# Patient Record
Sex: Female | Born: 1985
Health system: Southern US, Community
[De-identification: ages and names within clinical notes are randomized; demographics above are authoritative.]

## PROBLEM LIST (undated history)

## (undated) DIAGNOSIS — Z8619 Personal history of other infectious and parasitic diseases: Secondary | ICD-10-CM

## (undated) DIAGNOSIS — K219 Gastro-esophageal reflux disease without esophagitis: Secondary | ICD-10-CM

## (undated) DIAGNOSIS — A0100 Typhoid fever, unspecified: Secondary | ICD-10-CM

## (undated) DIAGNOSIS — A159 Respiratory tuberculosis unspecified: Secondary | ICD-10-CM

## (undated) DIAGNOSIS — F32A Depression, unspecified: Secondary | ICD-10-CM

## (undated) DIAGNOSIS — F329 Major depressive disorder, single episode, unspecified: Secondary | ICD-10-CM

## (undated) DIAGNOSIS — B019 Varicella without complication: Secondary | ICD-10-CM

## (undated) HISTORY — DX: Personal history of other infectious and parasitic diseases: Z86.19

## (undated) HISTORY — DX: Major depressive disorder, single episode, unspecified: F32.9

## (undated) HISTORY — DX: Gastro-esophageal reflux disease without esophagitis: K21.9

## (undated) HISTORY — DX: Depression, unspecified: F32.A

---

## 2017-03-30 LAB — OB RESULTS CONSOLE GC/CHLAMYDIA
CHLAMYDIA, DNA PROBE: NEGATIVE
Gonorrhea: NEGATIVE

## 2017-04-23 LAB — OB RESULTS CONSOLE RUBELLA ANTIBODY, IGM: Rubella: IMMUNE

## 2017-04-23 LAB — OB RESULTS CONSOLE ABO/RH: RH TYPE: POSITIVE

## 2017-04-23 LAB — OB RESULTS CONSOLE ANTIBODY SCREEN: Antibody Screen: NEGATIVE

## 2017-04-23 LAB — OB RESULTS CONSOLE HEPATITIS B SURFACE ANTIGEN: HEP B S AG: NEGATIVE

## 2017-04-23 LAB — OB RESULTS CONSOLE GBS: STREP GROUP B AG: POSITIVE

## 2017-04-23 LAB — OB RESULTS CONSOLE RPR: RPR: NONREACTIVE

## 2017-04-23 LAB — OB RESULTS CONSOLE HIV ANTIBODY (ROUTINE TESTING): HIV: NONREACTIVE

## 2017-09-07 NOTE — L&D Delivery Note (Signed)
Delivery Note At 3:35 PM a viable female was delivered via Vaginal, Spontaneous via OA presentation  APGAR:  9 9  weight pending .   Placenta status:spontaneously with 3 vessel cord , .  Cord:  with the following complications: none  Cord pH: not obtained  Anesthesia:  epidural Episiotomy: None Lacerations: 2nd degree Suture Repair: 3.0 chromic Est. Blood Loss (mL): 300  Mom to postpartum.  Baby to Couplet care / Skin to Skin.  Jenny Lane L 11/15/2017, 3:48 PM

## 2017-11-05 ENCOUNTER — Institutional Professional Consult (permissible substitution): Payer: Self-pay | Admitting: Pediatrics

## 2017-11-06 ENCOUNTER — Encounter: Payer: Self-pay | Admitting: Pediatrics

## 2017-11-06 ENCOUNTER — Ambulatory Visit (INDEPENDENT_AMBULATORY_CARE_PROVIDER_SITE_OTHER): Payer: Self-pay | Admitting: Pediatrics

## 2017-11-06 DIAGNOSIS — Z7681 Expectant parent(s) prebirth pediatrician visit: Secondary | ICD-10-CM

## 2017-11-06 NOTE — Progress Notes (Signed)
Prenatal counseling for impending newborn done-- Z76.81  

## 2017-11-09 ENCOUNTER — Institutional Professional Consult (permissible substitution): Payer: Self-pay | Admitting: Pediatrics

## 2017-11-15 ENCOUNTER — Inpatient Hospital Stay (HOSPITAL_COMMUNITY): Payer: 59 | Admitting: Anesthesiology

## 2017-11-15 ENCOUNTER — Inpatient Hospital Stay (HOSPITAL_COMMUNITY)
Admission: RE | Admit: 2017-11-15 | Discharge: 2017-11-17 | DRG: 807 | Disposition: A | Discharge status: Home / Self Care | Payer: 59 | Source: Ambulatory Visit | Attending: Obstetrics and Gynecology | Admitting: Obstetrics and Gynecology

## 2017-11-15 ENCOUNTER — Other Ambulatory Visit: Payer: Self-pay

## 2017-11-15 ENCOUNTER — Encounter (HOSPITAL_COMMUNITY): Payer: Self-pay

## 2017-11-15 DIAGNOSIS — O99824 Streptococcus B carrier state complicating childbirth: Secondary | ICD-10-CM | POA: Diagnosis present

## 2017-11-15 DIAGNOSIS — Z3A40 40 weeks gestation of pregnancy: Secondary | ICD-10-CM | POA: Diagnosis not present

## 2017-11-15 DIAGNOSIS — O480 Post-term pregnancy: Secondary | ICD-10-CM | POA: Diagnosis present

## 2017-11-15 HISTORY — DX: Varicella without complication: B01.9

## 2017-11-15 HISTORY — DX: Typhoid fever, unspecified: A01.00

## 2017-11-15 HISTORY — DX: Respiratory tuberculosis unspecified: A15.9

## 2017-11-15 LAB — CBC
HCT: 39.5 % (ref 36.0–46.0)
Hemoglobin: 13.8 g/dL (ref 12.0–15.0)
MCH: 30.1 pg (ref 26.0–34.0)
MCHC: 34.9 g/dL (ref 30.0–36.0)
MCV: 86.1 fL (ref 78.0–100.0)
PLATELETS: 218 10*3/uL (ref 150–400)
RBC: 4.59 MIL/uL (ref 3.87–5.11)
RDW: 15 % (ref 11.5–15.5)
WBC: 12.7 10*3/uL — ABNORMAL HIGH (ref 4.0–10.5)

## 2017-11-15 LAB — RPR: RPR Ser Ql: NONREACTIVE

## 2017-11-15 LAB — TYPE AND SCREEN
ABO/RH(D): O POS
Antibody Screen: NEGATIVE

## 2017-11-15 LAB — ABO/RH: ABO/RH(D): O POS

## 2017-11-15 MED ORDER — OXYTOCIN 40 UNITS IN LACTATED RINGERS INFUSION - SIMPLE MED
1.0000 m[IU]/min | INTRAVENOUS | Status: DC
  Administered 2017-11-15: 6 m[IU]/min via INTRAVENOUS
  Administered 2017-11-15: 12 m[IU]/min via INTRAVENOUS
  Administered 2017-11-15: 4 m[IU]/min via INTRAVENOUS
  Administered 2017-11-15: 10 m[IU]/min via INTRAVENOUS
  Administered 2017-11-15: 8 m[IU]/min via INTRAVENOUS
  Administered 2017-11-15: 2 m[IU]/min via INTRAVENOUS
  Administered 2017-11-15: 14 m[IU]/min via INTRAVENOUS

## 2017-11-15 MED ORDER — SENNOSIDES-DOCUSATE SODIUM 8.6-50 MG PO TABS
2.0000 | ORAL_TABLET | ORAL | Status: DC
  Administered 2017-11-16 – 2017-11-17 (×2): 2 via ORAL
  Filled 2017-11-15 (×3): qty 2

## 2017-11-15 MED ORDER — OXYCODONE-ACETAMINOPHEN 5-325 MG PO TABS: 1.0000 | ORAL_TABLET | ORAL | Status: DC | PRN

## 2017-11-15 MED ORDER — FLEET ENEMA 7-19 GM/118ML RE ENEM: 1.0000 | ENEMA | Freq: Every day | RECTAL | Status: DC | PRN

## 2017-11-15 MED ORDER — ACETAMINOPHEN 325 MG PO TABS
650.0000 mg | ORAL_TABLET | ORAL | Status: DC | PRN
  Administered 2017-11-15 – 2017-11-16 (×2): 650 mg via ORAL
  Filled 2017-11-15 (×2): qty 2

## 2017-11-15 MED ORDER — PHENYLEPHRINE 40 MCG/ML (10ML) SYRINGE FOR IV PUSH (FOR BLOOD PRESSURE SUPPORT)
80.0000 ug | PREFILLED_SYRINGE | INTRAVENOUS | Status: DC | PRN
  Filled 2017-11-15: qty 10
  Filled 2017-11-15: qty 5
  Filled 2017-11-15: qty 10

## 2017-11-15 MED ORDER — MEASLES, MUMPS & RUBELLA VAC ~~LOC~~ INJ: 0.5000 mL | INJECTION | Freq: Once | SUBCUTANEOUS | Status: DC

## 2017-11-15 MED ORDER — BENZOCAINE-MENTHOL 20-0.5 % EX AERO
1.0000 "application " | INHALATION_SPRAY | CUTANEOUS | Status: DC | PRN
  Administered 2017-11-15 – 2017-11-17 (×2): 1 via TOPICAL
  Filled 2017-11-15 (×2): qty 56

## 2017-11-15 MED ORDER — EPHEDRINE 5 MG/ML INJ
10.0000 mg | INTRAVENOUS | Status: DC | PRN
  Filled 2017-11-15: qty 2

## 2017-11-15 MED ORDER — MEDROXYPROGESTERONE ACETATE 150 MG/ML IM SUSP: 150.0000 mg | INTRAMUSCULAR | Status: DC | PRN

## 2017-11-15 MED ORDER — ONDANSETRON HCL 4 MG/2ML IJ SOLN: 4.0000 mg | INTRAMUSCULAR | Status: DC | PRN

## 2017-11-15 MED ORDER — TERBUTALINE SULFATE 1 MG/ML IJ SOLN
0.2500 mg | Freq: Once | INTRAMUSCULAR | Status: DC | PRN
  Filled 2017-11-15: qty 1

## 2017-11-15 MED ORDER — LACTATED RINGERS IV SOLN
500.0000 mL | Freq: Once | INTRAVENOUS | Status: AC
  Administered 2017-11-15: 500 mL via INTRAVENOUS

## 2017-11-15 MED ORDER — DIBUCAINE 1 % RE OINT: 1.0000 "application " | TOPICAL_OINTMENT | RECTAL | Status: DC | PRN

## 2017-11-15 MED ORDER — PHENYLEPHRINE 40 MCG/ML (10ML) SYRINGE FOR IV PUSH (FOR BLOOD PRESSURE SUPPORT)
80.0000 ug | PREFILLED_SYRINGE | INTRAVENOUS | Status: DC | PRN
  Filled 2017-11-15: qty 5

## 2017-11-15 MED ORDER — LIDOCAINE HCL (PF) 1 % IJ SOLN
30.0000 mL | INTRAMUSCULAR | Status: DC | PRN
  Filled 2017-11-15: qty 30

## 2017-11-15 MED ORDER — OXYTOCIN 40 UNITS IN LACTATED RINGERS INFUSION - SIMPLE MED
INTRAVENOUS | Status: AC
  Administered 2017-11-15: 2.5 [IU]/h via INTRAVENOUS
  Filled 2017-11-15: qty 1000

## 2017-11-15 MED ORDER — BISACODYL 10 MG RE SUPP: 10.0000 mg | Freq: Every day | RECTAL | Status: DC | PRN

## 2017-11-15 MED ORDER — ACETAMINOPHEN 325 MG PO TABS: 650.0000 mg | ORAL_TABLET | ORAL | Status: DC | PRN

## 2017-11-15 MED ORDER — FENTANYL 2.5 MCG/ML BUPIVACAINE 1/10 % EPIDURAL INFUSION (WH - ANES)
14.0000 mL/h | INTRAMUSCULAR | Status: DC | PRN
  Administered 2017-11-15: 14 mL/h via EPIDURAL
  Filled 2017-11-15: qty 100

## 2017-11-15 MED ORDER — OXYTOCIN 40 UNITS IN LACTATED RINGERS INFUSION - SIMPLE MED
2.5000 [IU]/h | INTRAVENOUS | Status: DC
  Administered 2017-11-15: 2.5 [IU]/h via INTRAVENOUS

## 2017-11-15 MED ORDER — LACTATED RINGERS IV SOLN
500.0000 mL | INTRAVENOUS | Status: DC | PRN
  Administered 2017-11-15: 500 mL via INTRAVENOUS

## 2017-11-15 MED ORDER — ONDANSETRON HCL 4 MG/2ML IJ SOLN: 4.0000 mg | Freq: Four times a day (QID) | INTRAMUSCULAR | Status: DC | PRN

## 2017-11-15 MED ORDER — PRENATAL MULTIVITAMIN CH
1.0000 | ORAL_TABLET | Freq: Every day | ORAL | Status: DC
  Administered 2017-11-16: 1 via ORAL
  Filled 2017-11-15: qty 1

## 2017-11-15 MED ORDER — OXYCODONE-ACETAMINOPHEN 5-325 MG PO TABS: 2.0000 | ORAL_TABLET | ORAL | Status: DC | PRN

## 2017-11-15 MED ORDER — SOD CITRATE-CITRIC ACID 500-334 MG/5ML PO SOLN: 30.0000 mL | ORAL | Status: DC | PRN

## 2017-11-15 MED ORDER — PENICILLIN G POT IN DEXTROSE 60000 UNIT/ML IV SOLN
3.0000 10*6.[IU] | INTRAVENOUS | Status: DC
  Administered 2017-11-15: 3 10*6.[IU] via INTRAVENOUS
  Filled 2017-11-15 (×4): qty 50

## 2017-11-15 MED ORDER — OXYTOCIN BOLUS FROM INFUSION
500.0000 mL | Freq: Once | INTRAVENOUS | Status: AC
  Administered 2017-11-15: 500 mL via INTRAVENOUS

## 2017-11-15 MED ORDER — ONDANSETRON HCL 4 MG PO TABS: 4.0000 mg | ORAL_TABLET | ORAL | Status: DC | PRN

## 2017-11-15 MED ORDER — DIPHENHYDRAMINE HCL 25 MG PO CAPS: 25.0000 mg | ORAL_CAPSULE | Freq: Four times a day (QID) | ORAL | Status: DC | PRN

## 2017-11-15 MED ORDER — WITCH HAZEL-GLYCERIN EX PADS: 1.0000 "application " | MEDICATED_PAD | CUTANEOUS | Status: DC | PRN

## 2017-11-15 MED ORDER — ZOLPIDEM TARTRATE 5 MG PO TABS: 5.0000 mg | ORAL_TABLET | Freq: Every evening | ORAL | Status: DC | PRN

## 2017-11-15 MED ORDER — IBUPROFEN 600 MG PO TABS
600.0000 mg | ORAL_TABLET | Freq: Four times a day (QID) | ORAL | Status: DC
  Administered 2017-11-15 – 2017-11-17 (×8): 600 mg via ORAL
  Filled 2017-11-15 (×8): qty 1

## 2017-11-15 MED ORDER — SIMETHICONE 80 MG PO CHEW: 80.0000 mg | CHEWABLE_TABLET | ORAL | Status: DC | PRN

## 2017-11-15 MED ORDER — LIDOCAINE HCL (PF) 1 % IJ SOLN
INTRAMUSCULAR | Status: DC | PRN
  Administered 2017-11-15: 13 mL via EPIDURAL

## 2017-11-15 MED ORDER — LACTATED RINGERS IV SOLN
INTRAVENOUS | Status: DC
  Administered 2017-11-15 (×2): via INTRAVENOUS

## 2017-11-15 MED ORDER — SODIUM CHLORIDE 0.9 % IV SOLN
5.0000 10*6.[IU] | Freq: Once | INTRAVENOUS | Status: AC
  Administered 2017-11-15: 5 10*6.[IU] via INTRAVENOUS
  Filled 2017-11-15: qty 5

## 2017-11-15 MED ORDER — TETANUS-DIPHTH-ACELL PERTUSSIS 5-2.5-18.5 LF-MCG/0.5 IM SUSP: 0.5000 mL | Freq: Once | INTRAMUSCULAR | Status: DC

## 2017-11-15 MED ORDER — COCONUT OIL OIL: 1.0000 "application " | TOPICAL_OIL | Status: DC | PRN

## 2017-11-15 MED ORDER — DIPHENHYDRAMINE HCL 50 MG/ML IJ SOLN: 12.5000 mg | INTRAMUSCULAR | Status: DC | PRN

## 2017-11-15 NOTE — Anesthesia Postprocedure Evaluation (Addendum)
Anesthesia Post Note  Patient: Jenny Lane  Procedure(s) Performed: AN AD HOC LABOR EPIDURAL     Patient location during evaluation: Mother Baby Anesthesia Type: Epidural Level of consciousness: awake, awake and alert, oriented and patient cooperative Pain management: pain level controlled Vital Signs Assessment: post-procedure vital signs reviewed and stable Respiratory status: spontaneous breathing Cardiovascular status: stable Postop Assessment: patient able to bend at knees, epidural receding, no apparent nausea or vomiting, no headache and no backache Anesthetic complications: no    Last Vitals:  Vitals:   11/15/17 1740 11/15/17 1838  BP: 117/79 124/83  Pulse: 70 75  Resp: 18 18  Temp: 36.8 C 37 C  SpO2:      Last Pain:  Vitals:   11/15/17 1840  TempSrc:   PainSc: 0-No pain   Pain Goal:                 Jennelle HumanLisa B Dario Yono

## 2017-11-15 NOTE — Anesthesia Preprocedure Evaluation (Signed)

## 2017-11-15 NOTE — Anesthesia Pain Management Evaluation Note (Signed)
  CRNA Pain Management Visit Note  Patient: Jenny Lane, 32 y.o., female  "Hello I am a member of the anesthesia team at [facility]. We have an anesthesia team available at all times to provide care throughout the hospital, including epidural management and anesthesia for C-section. I don't know your plan for the delivery whether it a natural birth, water birth, IV sedation, nitrous supplementation, doula or epidural, but we want to meet your pain goals."   1.Was your pain managed to your expectations on prior hospitalizations?   No prior interventions  2.What is your expectation for pain management during this hospitalization?     unsure of pain management plan  3.How can we help you reach that goal? On call for anesthesia services if desired  Record the patient's initial score and the patient's pain goal.   Pain: 6  Pain Goal: 2 [facility] wants you to be able to say your pain was always managed very well.  Jenny HumanLisa B Leyanna Lane 11/15/2017

## 2017-11-15 NOTE — Anesthesia Procedure Notes (Signed)
Epidural Patient location during procedure: OB Start time: 11/15/2017 1:41 PM End time: 11/15/2017 1:53 PM  Staffing Anesthesiologist: Lowella CurbMiller, Everest Hacking Ray, MD Performed: anesthesiologist   Preanesthetic Checklist Completed: patient identified, site marked, surgical consent, pre-op evaluation, timeout performed, IV checked, risks and benefits discussed and monitors and equipment checked  Epidural Patient position: sitting Prep: ChloraPrep Patient monitoring: heart rate, cardiac monitor, continuous pulse ox and blood pressure Approach: midline Location: L2-L3 Injection technique: LOR saline  Needle:  Needle type: Tuohy  Needle gauge: 17 G Needle length: 9 cm Needle insertion depth: 5 cm Catheter type: closed end flexible Catheter size: 20 Guage Catheter at skin depth: 9 cm Test dose: negative  Assessment Events: blood not aspirated, injection not painful, no injection resistance, negative IV test and no paresthesia  Additional Notes Reason for block:procedure for pain

## 2017-11-15 NOTE — H&P (Signed)
Barbaraann Fasteranya Wnek is a 32 year old G 2 P 1 at 40 weeks presents for IOL for post term.  OB History    Gravida Para Term Preterm AB Living   1             SAB TAB Ectopic Multiple Live Births                 No past medical history on file. No past surgical history on file. Family History: family history is not on file. Social History:  has no tobacco, alcohol, and drug history on file.     Maternal Diabetes: No Genetic Screening: Normal Maternal Ultrasounds/Referrals: Normal Fetal Ultrasounds or other Referrals:  None Maternal Substance Abuse:  No Significant Maternal Medications:  None Significant Maternal Lab Results:  None Other Comments:  None  Review of Systems  All other systems reviewed and are negative.  Maternal Medical History:  Reason for admission: Contractions.       Blood pressure (!) 132/97, pulse (!) 102, temperature 97.7 F (36.5 C), temperature source Oral, resp. rate 18, height 5\' 3"  (1.6 m), weight 77.9 kg (171 lb 11.4 oz).   Fetal Exam Fetal State Assessment: Category I - tracings are normal.     Physical Exam  Nursing note and vitals reviewed. Constitutional: She appears well-developed.  HENT:  Head: Normocephalic.  Eyes: Pupils are equal, round, and reactive to light.  Neck: Normal range of motion.  Cardiovascular: Normal rate and regular rhythm.  Respiratory: Effort normal.    Prenatal labs: ABO, Rh:   Antibody:   Rubella:   RPR:    HBsAg:    HIV:    GBS:     Assessment/Plan: IUP at 40 weeks  Post term pregnancy Pitocin prn contractions Penicillin for GBBS +   Orlen Leedy L 11/15/2017, 8:38 AM

## 2017-11-16 LAB — CBC
HEMATOCRIT: 34.4 % — AB (ref 36.0–46.0)
Hemoglobin: 11.6 g/dL — ABNORMAL LOW (ref 12.0–15.0)
MCH: 29.5 pg (ref 26.0–34.0)
MCHC: 33.7 g/dL (ref 30.0–36.0)
MCV: 87.5 fL (ref 78.0–100.0)
Platelets: 167 10*3/uL (ref 150–400)
RBC: 3.93 MIL/uL (ref 3.87–5.11)
RDW: 15.1 % (ref 11.5–15.5)
WBC: 14.2 10*3/uL — ABNORMAL HIGH (ref 4.0–10.5)

## 2017-11-16 LAB — RUBELLA SCREEN: Rubella: 6.17 index (ref >0.99)

## 2017-11-16 NOTE — Progress Notes (Signed)
Post Partum Day 1 Subjective: no complaints, up ad lib, voiding and tolerating PO  Objective: Blood pressure 108/70, pulse 67, temperature (!) 97.4 F (36.3 C), temperature source Oral, resp. rate 18, height 5\' 3"  (1.6 m), weight 171 lb 11.4 oz (77.9 kg), last menstrual period 02/15/2017, SpO2 99 %, unknown if currently breastfeeding.  Physical Exam:  General: alert, cooperative, appears stated age and no distress Lochia: appropriate Uterine Fundus: firm Incision: healing well DVT Evaluation: No evidence of DVT seen on physical exam.  Recent Labs    11/15/17 0845 11/16/17 0541  HGB 13.8 11.6*  HCT 39.5 34.4*    Assessment/Plan: Plan for discharge tomorrow and Breastfeeding   LOS: 1 day   Brexlee Heberlein C 11/16/2017, 9:37 AM

## 2017-11-16 NOTE — Lactation Note (Signed)
This note was copied from a baby's chart. Lactation Consultation Note  Patient Name: Girl Barbaraann Fasteranya Niblack ZOXWR'UToday's Date: 11/16/2017 Reason for consult: Initial assessment  Infant has not fed since 0800 this morning when mom gave her formula.  When questioned about the reason for the formula mother stated that she did not have "enough milk" for the baby.  Explained that colostrum is present and available for baby.  No formula is needed at this time.  Baby is sleepy so undressed down to her diaper and attempted to awaken infant.  Mother desires the cross cradle hold and attempted to assist with latch.  Infant would not arouse enough or open mouth wide enough to latch on.  Mother encouraged to put infant skin to skin and watch for feeding cues within the next hour.  She understood but said she wanted to eat lunch.  Advised her to eat and then put baby skin to skin after her lunch.  Mother agreed with this plan. Maternal Data    Feeding Feeding Type: Breast Fed Length of feed: 0 min(Infant too sleepy)  LATCH Score Latch: Too sleepy or reluctant, no latch achieved, no sucking elicited.  Audible Swallowing: None  Type of Nipple: Everted at rest and after stimulation  Comfort (Breast/Nipple): Soft / non-tender  Hold (Positioning): Full assist, staff holds infant at breast  LATCH Score: 4  Interventions Interventions: Breast feeding basics reviewed;Assisted with latch;Skin to skin;Support pillows;Adjust position  Lactation Tools Discussed/Used     Consult Status Consult Status: Follow-up Date: 11/17/17 Follow-up type: In-patient    Chanan Detwiler R Tana Trefry 11/16/2017, 1:22 PM

## 2017-11-16 NOTE — Progress Notes (Signed)
Patient states she is dizzy when she gets up. Patient told to call out if she needs assistance.Call bell next to patient. Patient's fundus firm no clots bleeding stable. Will keep IV in. Mom told to call for latch. ID check done.

## 2017-11-17 NOTE — Lactation Note (Addendum)
This note was copied from a baby's chart. Lactation Consultation Note  Patient Name: Jenny Lane: 11/17/2017 Reason for consult: Follow-up assessment  Follow up visit prior to discharge.  Mother has her own pump from home and wanted help checking flange size and pump set up.  LC determined that flange size #24 was the appropriate size with pump suction and told mother she may need to go up to a size #27 when her milk comes in.  Infant has not eaten recently and mother asked LC to watch/help with latch.  LC suggested cross cradle and assisted mother with this hold; this was the first time she attempted this hold.  Pillows arranged and infant put to breast but was unsuccessful due to sleepiness.  Infant undressed and attempted to feed again without success.  The football hold on the right breast was attempted next.  Infant still remained sleepy and did not open mouth for a latch.  LC gave mother shells and manual pump with all instructions on use.  Mother verbalized understanding.  Engorgement and nipple prevention/soreness discussed.  LC Plan; Wear breast shells between feedings, pre-pump with manual pump if necessary, reverse pressure softening. feed infant, post pump until milk comes to volume.    Mom made aware of O/P services, breastfeeding support groups, community resources, and our phone # for post-discharge questions.  Maternal Data Has patient been taught Hand Expression?: Yes Does the patient have breastfeeding experience prior to this delivery?: No  Feeding Feeding Type: Breast Fed Length of feed: 0 min  LATCH Score Latch: Too sleepy or reluctant, no latch achieved, no sucking elicited.  Audible Swallowing: None  Type of Nipple: Inverted  Comfort (Breast/Nipple): Soft / non-tender  Hold (Positioning): Full assist, staff holds infant at breast(infant too sleepy and not wanting to open mouth)  LATCH Score: 2  Interventions Interventions: Breast feeding  basics reviewed;Assisted with latch;Skin to skin;Breast massage;Support pillows;Adjust position;Shells;DEBP  Lactation Tools Discussed/Used Tools: Shells;Pump;Flanges;Coconut oil Flange Size: 24 Shell Type: Inverted Breast pump type: Double-Electric Breast Pump;Manual;Other (comment)(pump from home)   Consult Status Consult Status: Complete Lane: 11/17/17 Follow-up type: In-patient    Dora SimsBeth R Maisey Deandrade 11/17/2017, 12:49 PM

## 2017-11-17 NOTE — Discharge Summary (Signed)
Obstetric Discharge Summary Reason for Admission: induction of labor Prenatal Procedures: none Intrapartum Procedures: spontaneous vaginal delivery Postpartum Procedures: none Complications-Operative and Postpartum: 2nd degree perineal laceration Hemoglobin  Date Value Ref Range Status  11/16/2017 11.6 (L) 12.0 - 15.0 g/dL Final   HCT  Date Value Ref Range Status  11/16/2017 34.4 (L) 36.0 - 46.0 % Final    Physical Exam:  General: alert, cooperative and appears stated age 20Lochia: appropriate Uterine Fundus: firm Incision: healing well, no significant drainage, no dehiscence DVT Evaluation: No evidence of DVT seen on physical exam.  Discharge Diagnoses: Term Pregnancy-delivered  Discharge Information: Date: 11/17/2017 Activity: pelvic rest Diet: routine Medications: None Condition: stable Instructions: refer to practice specific booklet Discharge to: home   Newborn Data: Live born female  Birth Weight: 8 lb 2 oz (3685 g) APGAR: 9, 9  Newborn Delivery   Birth date/time:  11/15/2017 15:35:00 Delivery type:  Vaginal, Spontaneous     Home with mother.  Ranae Pilalise Jennifer Leger 11/17/2017, 8:58 AM

## 2017-11-22 ENCOUNTER — Inpatient Hospital Stay (HOSPITAL_COMMUNITY): Admission: AD | Admit: 2017-11-22 | Payer: Self-pay | Source: Ambulatory Visit | Admitting: Obstetrics and Gynecology

## 2018-06-16 ENCOUNTER — Encounter: Payer: Self-pay | Admitting: Family Medicine

## 2018-06-16 ENCOUNTER — Ambulatory Visit (INDEPENDENT_AMBULATORY_CARE_PROVIDER_SITE_OTHER): Payer: 59 | Admitting: Family Medicine

## 2018-06-16 VITALS — BP 106/68 | HR 90 | Temp 98.3°F | Ht 63.0 in | Wt 150.6 lb

## 2018-06-16 DIAGNOSIS — O99345 Other mental disorders complicating the puerperium: Secondary | ICD-10-CM

## 2018-06-16 DIAGNOSIS — M545 Low back pain, unspecified: Secondary | ICD-10-CM

## 2018-06-16 DIAGNOSIS — Z0000 Encounter for general adult medical examination without abnormal findings: Secondary | ICD-10-CM

## 2018-06-16 DIAGNOSIS — F53 Postpartum depression: Secondary | ICD-10-CM | POA: Diagnosis not present

## 2018-06-16 MED ORDER — IBUPROFEN 600 MG PO TABS: 600.0000 mg | ORAL_TABLET | Freq: Three times a day (TID) | ORAL | 0 refills | Status: DC | PRN

## 2018-06-16 NOTE — Progress Notes (Signed)
Patient: Jenny Lane MRN: [id number] DOB: [birth year] PCP: Orland Mustard, MD     Subjective:  Chief Complaint  Patient presents with  . Establish Care    HPI: The patient is a 32 y.o. female who presents today for annual exam. She denies any changes to past medical history. There have been no recent hospitalizations. They are not following a well balanced diet and exercise plan. Weight has been decreasing rapidly. She just had her second child in March. She is not really exercising except for house work. She does state she eats healthy. She is currently breast feeding.   Post partum depression: She was started on zoloft for PPD about 3 weeks after delivery. She had this with her first baby and didn't require medication. She is well controlled on her current medication and is wanting to stop it, but her OB recommended that she do this for a year and she will do this.   Back pain: she has had pain in her lower back since she delivered. She did get an epidural. Pain is intermittent. Pain is better with movement. Pain comes randomly. Pain rated as a 6/10 and is sharp in nature. She has only taken tylenol for the pain once and it did not help her much. She did have pain during her pregnancy in the same area, but it felt different. No weakness in her legs, no numbness/tingling and no urinary incontinence. She had started PT at [facility], but stopped as it wasn't in network. No xray was done. She did feel like this was helping her some. Saw Dr. Ethelene Hal at [facility] who reassured her that this was due to pregnancy and should get better. Recommended PT and f/u in one month.   Sinus issues: she is currently taking zyrtec and mucinex. The medication has helped some. She has also used flonase a few times. Symptoms did get better then came back. Started last week. No fever/chills.   There is no immunization history for the selected administration types on file for this patient.  Pap smear: due for this  she thinks.  Tdap: utd. 06/2017   Review of Systems  Constitutional: Negative for chills, fatigue and fever.  HENT: Positive for sinus pain and sore throat. Negative for congestion, dental problem, ear pain, hearing loss and trouble swallowing.   Eyes: Negative for visual disturbance.  Respiratory: Positive for chest tightness. Negative for cough and shortness of breath.   Cardiovascular: Negative for chest pain, palpitations and leg swelling.  Gastrointestinal: Negative for abdominal pain, blood in stool, diarrhea and nausea.  Endocrine: Negative for cold intolerance, polydipsia, polyphagia and polyuria.  Genitourinary: Negative for dysuria and hematuria.  Musculoskeletal: Negative for arthralgias and neck pain.  Skin: Negative for rash.  Neurological: Positive for headaches. Negative for dizziness.  Psychiatric/Behavioral: Negative for dysphoric mood and sleep disturbance. The patient is not nervous/anxious.     Allergies Patient is allergic to biaxin [clarithromycin]; cefdinir; clindamycin/lincomycin; sulfa antibiotics; tetracyclines & related; and ampicillin.  Past Medical History Patient  has a past medical history of Depression, Tuberculosis, Typhoid fever, and Varicella.  Surgical History Patient  has no past surgical history on file.  Family History Pateint's family history includes Diabetes in her father.  Social History Patient  reports that she has never smoked. She has never used smokeless tobacco. She reports that she does not drink alcohol or use drugs.    Objective: Vitals:   06/16/18 1316  BP: 106/68  Pulse: 90  Temp: 98.3 F (36.8  C)  TempSrc: Oral  SpO2: 97%  Weight: 150 lb 9.6 oz (68.3 kg)  Height: 5\' 3"  (1.6 m)    Body mass index is 26.68 kg/m.  Physical Exam  Constitutional: She is oriented to person, place, and time. She appears well-developed and well-nourished.  HENT:  Right Ear: External ear normal.  Left Ear: External ear normal.   Mouth/Throat: Oropharynx is clear and moist.  Eyes: Pupils are equal, round, and reactive to light. Conjunctivae and EOM are normal.  Neck: Normal range of motion. Neck supple. No thyromegaly present.  Cardiovascular: Normal rate, regular rhythm, normal heart sounds and intact distal pulses.  No murmur heard. Pulmonary/Chest: Effort normal and breath sounds normal.  Abdominal: Soft. Bowel sounds are normal. She exhibits no distension. There is no tenderness.  Musculoskeletal:  Strength 5/5 bilateral lower legs. TTP over bilateral SI joints.   Lymphadenopathy:    She has no cervical adenopathy.  Neurological: She is alert and oriented to person, place, and time. She displays normal reflexes. No cranial nerve deficit. Coordination normal.  Skin: Skin is warm and dry. No rash noted.  Psychiatric: She has a normal mood and affect. Her behavior is normal.  Vitals reviewed.        Assessment/plan: 1. Annual physical exam Would like to do routine lab work today, but she states she gets labs with her insurance. Discussed to bring me a copy. utd on health maintenance. Discussed conservative therapy for her sinuses since she is breast feeding let me know if not getting better.  Patient counseling [x]    Nutrition: Stressed importance of moderation in sodium/caffeine intake, saturated fat and cholesterol, caloric balance, sufficient intake of fresh fruits, vegetables, fiber, calcium, iron, and 1 mg of folate supplement per day (for females capable of pregnancy).  [x]    Stressed the importance of regular exercise.   [x]    Substance Abuse: Discussed cessation/primary prevention of tobacco, alcohol, or other drug use; driving or other dangerous activities under the influence; availability of treatment for abuse.   [x]    Injury prevention: Discussed safety belts, safety helmets, smoke detector, smoking near bedding or upholstery.   [x]    Sexuality: Discussed sexually transmitted diseases, partner  selection, use of condoms, avoidance of unintended pregnancy  and contraceptive alternatives.  [x]    Dental health: Discussed importance of regular tooth brushing, flossing, and dental visits.  [x]    Health maintenance and immunizations reviewed. Please refer to Health maintenance section.     2. Low back pain without sciatica, unspecified back pain laterality, unspecified chronicity Reviewed ortho notes. He thought more related to pregnancy changes and prescribed nsaids/PT. Was doing well in PT and will do this again for her here. Advised nsaids prn/heating pad. Will see how she does in PT. If no improvement will get xray and send to sports med.  - Ambulatory referral to Physical Therapy  3. Post partum depression  Doing well on her zolot. Wean per her ob.   Return if symptoms worsen or fail to improve.     Orland Mustard, MD [facility]  06/16/2018

## 2018-06-16 NOTE — Patient Instructions (Signed)
Sending in ibuprofen for your back. Can take up to three times a day with food as needed for pain.   Sending you to PT HERE! They will call you.   If back not better after PT we need to do xrays.   Let me know if sinus symptoms do not get better.

## 2018-06-17 ENCOUNTER — Encounter: Payer: Self-pay | Admitting: Family Medicine

## 2018-06-22 ENCOUNTER — Encounter: Payer: Self-pay | Admitting: Physical Therapy

## 2018-06-22 ENCOUNTER — Ambulatory Visit (INDEPENDENT_AMBULATORY_CARE_PROVIDER_SITE_OTHER): Payer: 59 | Admitting: Physical Therapy

## 2018-06-22 DIAGNOSIS — M6281 Muscle weakness (generalized): Secondary | ICD-10-CM | POA: Diagnosis not present

## 2018-06-22 DIAGNOSIS — M545 Low back pain, unspecified: Secondary | ICD-10-CM

## 2018-06-22 NOTE — Therapy (Signed)
[facility] [address] [place], [place], [address] Phone: [phone]   Fax:  [phone]  Physical Therapy Evaluation  Patient Details  Name: Jenny Lane MRN: [id number] Date of Birth: 1986/08/02 Referring Provider (PT): Orland Mustard   Encounter Date: 06/22/2018  PT End of Session - 06/22/18 1314    Visit Number  1    Number of Visits  12    Date for PT Re-Evaluation  08/03/18    Authorization Type  UHC    PT Start Time  1302    PT Stop Time  1345    PT Time Calculation (min)  43 min    Activity Tolerance  Patient tolerated treatment well    Behavior During Therapy  [facility] for tasks assessed/performed       Past Medical History:  Diagnosis Date  . Depression   . History of chicken pox   . Tuberculosis    Received 9 Months of Treatement in 2006  . Typhoid fever   . Varicella    As a child     History reviewed. No pertinent surgical history.  There were no vitals filed for this visit.   Subjective Assessment - 06/22/18 1307    Subjective  Pt states increased pain since she gave birth to daughter, 7 mo ago. She states increased pain in bil lumbar region, increased with bending, She is still breastfeeding, does have pain with sitting, feeding as well. She had a few prior PT visits which she felt was helpful, but has not continued to do exercises.      Limitations  Sitting;Standing;Walking;House hold activities    Patient Stated Goals  decreased pain    Currently in Pain?  Yes    Pain Score  6     Pain Location  Back    Pain Orientation  Right;Left;Lower    Pain Descriptors / Indicators  Sharp;Shooting;Aching    Pain Type  Acute pain    Pain Onset  More than a month ago    Pain Frequency  Intermittent    Aggravating Factors   standing, walking, bending, child care.          [facility] PT Assessment - 06/22/18 0001      Assessment   Medical Diagnosis  Low Back Pain    Referring Provider (PT)  Orland Mustard    Prior  Therapy  yes- 2 visits      Balance Screen   Has the patient fallen in the past 6 months  No      Prior Function   Level of Independence  Independent      Cognition   Overall Cognitive Status  Within Functional Limits for tasks assessed      Posture/Postural Control   Posture Comments  Seated: poor posture, with rounded back and shoulders;  Standing: poor posture: increased lumbar lordosis and anterior pelvic tilt;      ROM / Strength   AROM / PROM / Strength  AROM;Strength      AROM   Overall AROM Comments  Lumbar: Mod limitations for flex/ext with increased pain with both motions. SB: WFL, no pain.  Hips: WFL;       Strength   Overall Strength Comments  Hips: 4-/5;  Core: 3-/5;       Palpation   Palpation comment  mild tightness and tenderness to palpate lumbar parsapinals. Tenderness with PA mobs;  Significant tenderness to Bil SI;        Special Tests  Other special tests  L LE shorter in supine;  Long sit test Neg; (will continue to re-assess); Neg SLR;  + hamstring tightness; Neg Faber; Signfiicant lumbar/core instability noted with trial for bridge exercise;                 Objective measurements completed on examination: See above findings.      [facility] Adult PT Treatment/Exercise - 06/22/18 0001      Exercises   Exercises  Lumbar      Lumbar Exercises: Stretches   Active Hamstring Stretch  2 reps;30 seconds    Single Knee to Chest Stretch  3 reps;30 seconds    Pelvic Tilt  20 reps    Piriformis Stretch  2 reps;30 seconds    Piriformis Stretch Limitations  supie and seated      Lumbar Exercises: Supine   Ab Set  10 reps             PT Education - 06/22/18 1314    Education Details  HEP , PT POC     Person(s) Educated  Patient    Methods  Explanation    Comprehension  Verbalized understanding       PT Short Term Goals - 06/22/18 1408      PT SHORT TERM GOAL #1   Title  Pt to report decreased pain in low back to 4/10 with activity      Time  2    Period  Weeks    Status  New    Target Date  07/06/18      PT SHORT TERM GOAL #2   Title  Pt to be independent with initial HEp     Time  2    Period  Weeks    Status  New    Target Date  07/06/18        PT Long Term Goals - 06/22/18 1409      PT LONG TERM GOAL #1   Title  Pt to be independent with long term/final HEP for lumbar stabilization    Time  6    Period  Weeks    Status  New    Target Date  08/03/18      PT LONG TERM GOAL #2   Title  Pt to report decreased pain in lumbar region to 0-2/10 with activity and child care    Time  6    Period  Weeks    Status  New    Target Date  08/03/18      PT LONG TERM GOAL #3   Title  Pt to demo improved strength of hips and core to at least 4+/5, with minimal postural changes seen with stabilization exercises.     Time  6    Period  Weeks    Status  New    Target Date  08/03/18      PT LONG TERM GOAL #4   Title  Pt to demo ability to self correct posture in clinic, and verbalize proper posture for child care.     Time  6    Period  Weeks    Status  New    Target Date  08/03/18             Plan - 06/22/18 1413    Clinical Impression Statement  Pt presents with primary complaint of increased pain in bilateral low back and SI region. Pt with (flexible) increased anterior pelvic tilt posture, with increased lordosis, likely  increased from recent pregnancy. She has significant weakness and poor stabilization in core and hips, and lack of effective HEP. Pt with increased tightness in lumbar paraspinals, and hamstrings.  Pt with poor seated and standing posture and will benefit from education on this. Pt with decreased ability for full functional activities, and child care, due to increased pain. Pt to benefit from skilled PT to improve pain and deficits.     Clinical Presentation  Stable    Clinical Decision Making  Low    Rehab Potential  Good    PT Frequency  2x / week    PT Duration  6 weeks    PT  Treatment/Interventions  ADLs/Self Care Home Management;Cryotherapy;Electrical Stimulation;Iontophoresis 4mg /ml Dexamethasone;Moist Heat;Therapeutic activities;Functional mobility training;Stair training;Gait training;Ultrasound;Therapeutic exercise;Balance training;Neuromuscular re-education;Patient/family education;Dry needling;Passive range of motion;Manual techniques;Taping    Consulted and Agree with Plan of Care  Patient       Patient will benefit from skilled therapeutic intervention in order to improve the following deficits and impairments:  Decreased endurance, Pain, Decreased strength, Decreased activity tolerance, Decreased mobility, Increased muscle spasms, Improper body mechanics, Decreased range of motion, Hypermobility, Impaired flexibility  Visit Diagnosis: Acute bilateral low back pain without sciatica  Muscle weakness (generalized)     Problem List There are no active problems to display for this patient.   Sedalia Muta, PT, DPT 3:09 PM  06/22/18    [facility] [address] [place], [place], [address] Phone: [phone]   Fax:  [phone]  Name: Jenny Lane MRN: [id number] Date of Birth: 1986-03-18

## 2018-06-28 ENCOUNTER — Encounter: Payer: Self-pay | Admitting: Family Medicine

## 2018-06-29 ENCOUNTER — Other Ambulatory Visit: Payer: Self-pay | Admitting: Family Medicine

## 2018-06-29 ENCOUNTER — Encounter: Payer: Self-pay | Admitting: Physical Therapy

## 2018-06-29 ENCOUNTER — Other Ambulatory Visit (HOSPITAL_COMMUNITY): Payer: Self-pay | Admitting: Family Medicine

## 2018-06-29 ENCOUNTER — Ambulatory Visit (INDEPENDENT_AMBULATORY_CARE_PROVIDER_SITE_OTHER): Payer: 59 | Admitting: Physical Therapy

## 2018-06-29 DIAGNOSIS — M6281 Muscle weakness (generalized): Secondary | ICD-10-CM

## 2018-06-29 DIAGNOSIS — M545 Low back pain, unspecified: Secondary | ICD-10-CM

## 2018-06-29 DIAGNOSIS — Z0000 Encounter for general adult medical examination without abnormal findings: Secondary | ICD-10-CM

## 2018-06-29 DIAGNOSIS — E559 Vitamin D deficiency, unspecified: Secondary | ICD-10-CM

## 2018-06-29 DIAGNOSIS — E538 Deficiency of other specified B group vitamins: Secondary | ICD-10-CM

## 2018-07-03 ENCOUNTER — Encounter: Payer: Self-pay | Admitting: Physical Therapy

## 2018-07-03 NOTE — Therapy (Signed)
[facility] [address] [place], [place], [address] Phone: [phone]   Fax:  [phone]  Physical Therapy Treatment  Patient Details  Name: Jenny Lane MRN: [id number] Date of Birth: April 24, 1986 Referring Provider (PT): Orland Mustard   Encounter Date: 06/29/2018  PT End of Session - 07/03/18 1412    Visit Number  2    Number of Visits  12    Date for PT Re-Evaluation  08/03/18    Authorization Type  UHC    PT Start Time  1306    PT Stop Time  1347    PT Time Calculation (min)  41 min    Activity Tolerance  Patient tolerated treatment well    Behavior During Therapy  [facility] for tasks assessed/performed       Past Medical History:  Diagnosis Date  . Depression   . History of chicken pox   . Tuberculosis    Received 9 Months of Treatement in 2006  . Typhoid fever   . Varicella    As a child     History reviewed. No pertinent surgical history.  There were no vitals filed for this visit.  Subjective Assessment - 07/03/18 1411    Subjective  Pt states that she has been doing HEP, mild improvement in pain.     Currently in Pain?  Yes    Pain Score  6     Pain Location  Back    Pain Orientation  Right;Left;Lower    Pain Descriptors / Indicators  Aching;Shooting;Sharp    Pain Type  Acute pain    Pain Onset  More than a month ago    Pain Frequency  Intermittent                       [facility] Adult PT Treatment/Exercise - 07/03/18 0001      Posture/Postural Control   Posture Comments  Seated: poor posture, with rounded back and shoulders;  Standing: poor posture: increased lumbar lordosis and anterior pelvic tilt;      Exercises   Exercises  Lumbar      Lumbar Exercises: Stretches   Active Hamstring Stretch  2 reps;30 seconds    Single Knee to Chest Stretch  3 reps;30 seconds    Pelvic Tilt  20 reps    Piriformis Stretch  2 reps;30 seconds    Piriformis Stretch Limitations  seated      Lumbar  Exercises: Aerobic   Stationary Bike  L1 x 6  min      Lumbar Exercises: Supine   Ab Set  10 reps    Clam  15 reps    Clam Limitations  RTB with TA    Bent Knee Raise  15 reps    Bent Knee Raise Limitations  with TA      Lumbar Exercises: Sidelying   Hip Abduction  10 reps;Both      Manual Therapy   Manual Therapy  Manual Traction;Muscle Energy Technique    Manual Traction  Long leg distraction on L;     Muscle Energy Technique  For L pelvic rotation:                PT Short Term Goals - 06/22/18 1408      PT SHORT TERM GOAL #1   Title  Pt to report decreased pain in low back to 4/10 with activity     Time  2    Period  Weeks  Status  New    Target Date  07/06/18      PT SHORT TERM GOAL #2   Title  Pt to be independent with initial HEp     Time  2    Period  Weeks    Status  New    Target Date  07/06/18        PT Long Term Goals - 06/22/18 1409      PT LONG TERM GOAL #1   Title  Pt to be independent with long term/final HEP for lumbar stabilization    Time  6    Period  Weeks    Status  New    Target Date  08/03/18      PT LONG TERM GOAL #2   Title  Pt to report decreased pain in lumbar region to 0-2/10 with activity and child care    Time  6    Period  Weeks    Status  New    Target Date  08/03/18      PT LONG TERM GOAL #3   Title  Pt to demo improved strength of hips and core to at least 4+/5, with minimal postural changes seen with stabilization exercises.     Time  6    Period  Weeks    Status  New    Target Date  08/03/18      PT LONG TERM GOAL #4   Title  Pt to demo ability to self correct posture in clinic, and verbalize proper posture for child care.     Time  6    Period  Weeks    Status  New    Target Date  08/03/18            Plan - 07/03/18 1412    Clinical Impression Statement  Pt progressed with ther ex for core strengt and stabilization. Pt requires max cueing for mechanics and activation of TA. Pt with increased  pain at L SI region with lifitng/lowering leg. Plan to progress as pt able.     Rehab Potential  Good    PT Frequency  2x / week    PT Duration  6 weeks    PT Treatment/Interventions  ADLs/Self Care Home Management;Cryotherapy;Electrical Stimulation;Iontophoresis 4mg /ml Dexamethasone;Moist Heat;Therapeutic activities;Functional mobility training;Stair training;Gait training;Ultrasound;Therapeutic exercise;Balance training;Neuromuscular re-education;Patient/family education;Dry needling;Passive range of motion;Manual techniques;Taping    Consulted and Agree with Plan of Care  Patient       Patient will benefit from skilled therapeutic intervention in order to improve the following deficits and impairments:  Decreased endurance, Pain, Decreased strength, Decreased activity tolerance, Decreased mobility, Increased muscle spasms, Improper body mechanics, Decreased range of motion, Hypermobility, Impaired flexibility  Visit Diagnosis: Acute bilateral low back pain without sciatica  Muscle weakness (generalized)     Problem List There are no active problems to display for this patient.   Sedalia Muta, PT, DPT 2:16 PM  07/03/18    [facility] [address] [place], [place], [address] Phone: [phone]   Fax:  [phone]  Name: Jenny Lane MRN: [id number] Date of Birth: 03/06/1986

## 2018-07-06 ENCOUNTER — Ambulatory Visit (INDEPENDENT_AMBULATORY_CARE_PROVIDER_SITE_OTHER): Payer: 59 | Admitting: Physical Therapy

## 2018-07-06 ENCOUNTER — Other Ambulatory Visit (INDEPENDENT_AMBULATORY_CARE_PROVIDER_SITE_OTHER): Payer: 59

## 2018-07-06 DIAGNOSIS — E559 Vitamin D deficiency, unspecified: Secondary | ICD-10-CM

## 2018-07-06 DIAGNOSIS — M545 Low back pain, unspecified: Secondary | ICD-10-CM

## 2018-07-06 DIAGNOSIS — E538 Deficiency of other specified B group vitamins: Secondary | ICD-10-CM

## 2018-07-06 DIAGNOSIS — Z0000 Encounter for general adult medical examination without abnormal findings: Secondary | ICD-10-CM | POA: Diagnosis not present

## 2018-07-06 DIAGNOSIS — M6281 Muscle weakness (generalized): Secondary | ICD-10-CM | POA: Diagnosis not present

## 2018-07-06 LAB — COMPREHENSIVE METABOLIC PANEL
ALBUMIN: 4.6 g/dL (ref 3.5–5.2)
ALK PHOS: 70 U/L (ref 39–117)
ALT: 18 U/L (ref 0–35)
AST: 17 U/L (ref 0–37)
BILIRUBIN TOTAL: 0.3 mg/dL (ref 0.2–1.2)
BUN: 15 mg/dL (ref 6–23)
CO2: 24 mEq/L (ref 19–32)
Calcium: 9.8 mg/dL (ref 8.4–10.5)
Chloride: 104 mEq/L (ref 96–112)
Creatinine, Ser: 0.74 mg/dL (ref 0.40–1.20)
GFR: 95.83 mL/min (ref >60.00)
Glucose, Bld: 122 mg/dL — ABNORMAL HIGH (ref 70–99)
POTASSIUM: 3.7 meq/L (ref 3.5–5.1)
SODIUM: 138 meq/L (ref 135–145)
Total Protein: 8 g/dL (ref 6.0–8.3)

## 2018-07-06 LAB — CBC WITH DIFFERENTIAL/PLATELET
Basophils Absolute: 0 10*3/uL (ref 0.0–0.1)
Basophils Relative: 0.3 % (ref 0.0–3.0)
EOS PCT: 4.5 % (ref 0.0–5.0)
Eosinophils Absolute: 0.5 10*3/uL (ref 0.0–0.7)
HCT: 39.4 % (ref 36.0–46.0)
Hemoglobin: 13.5 g/dL (ref 12.0–15.0)
LYMPHS ABS: 2.6 10*3/uL (ref 0.7–4.0)
Lymphocytes Relative: 23.3 % (ref 12.0–46.0)
MCHC: 34.2 g/dL (ref 30.0–36.0)
MCV: 86.9 fl (ref 78.0–100.0)
MONO ABS: 0.6 10*3/uL (ref 0.1–1.0)
Monocytes Relative: 5.7 % (ref 3.0–12.0)
NEUTROS ABS: 7.4 10*3/uL (ref 1.4–7.7)
Neutrophils Relative %: 66.2 % (ref 43.0–77.0)
Platelets: 350 10*3/uL (ref 150.0–400.0)
RBC: 4.54 Mil/uL (ref 3.87–5.11)
RDW: 14 % (ref 11.5–15.5)
WBC: 11.2 10*3/uL — ABNORMAL HIGH (ref 4.0–10.5)

## 2018-07-06 LAB — LIPID PANEL
CHOLESTEROL: 160 mg/dL (ref 0–200)
HDL: 48 mg/dL (ref >39.00)
LDL Cholesterol: 84 mg/dL (ref 0–99)
NONHDL: 112.45
Total CHOL/HDL Ratio: 3
Triglycerides: 142 mg/dL (ref 0.0–149.0)
VLDL: 28.4 mg/dL (ref 0.0–40.0)

## 2018-07-06 LAB — TSH: TSH: 1.55 u[IU]/mL (ref 0.35–4.50)

## 2018-07-06 LAB — VITAMIN B12: Vitamin B-12: 297 pg/mL (ref 211–911)

## 2018-07-06 LAB — VITAMIN D 25 HYDROXY (VIT D DEFICIENCY, FRACTURES): VITD: 31.1 ng/mL (ref 30.00–100.00)

## 2018-07-06 NOTE — Addendum Note (Signed)
Addended by: London Sheer T on: 07/06/2018 02:13 PM   Modules accepted: Orders

## 2018-07-07 ENCOUNTER — Other Ambulatory Visit: Payer: Self-pay | Admitting: Family Medicine

## 2018-07-07 ENCOUNTER — Other Ambulatory Visit (INDEPENDENT_AMBULATORY_CARE_PROVIDER_SITE_OTHER): Payer: 59

## 2018-07-07 ENCOUNTER — Encounter: Payer: Self-pay | Admitting: Physical Therapy

## 2018-07-07 DIAGNOSIS — R7309 Other abnormal glucose: Secondary | ICD-10-CM

## 2018-07-07 LAB — HEMOGLOBIN A1C: Hgb A1c MFr Bld: 5.5 % (ref 4.6–6.5)

## 2018-07-07 NOTE — Therapy (Signed)
[facility] [address] [place], [place], [address] Phone: [phone]   Fax:  [phone]  Physical Therapy Treatment  Patient Details  Name: Jenny Lane MRN: [id number] Date of Birth: 01-Feb-1986 Referring Provider (PT): Orland Mustard   Encounter Date: 07/06/2018  PT End of Session - 07/07/18 0847    Visit Number  3    Number of Visits  12    Date for PT Re-Evaluation  08/03/18    Authorization Type  UHC    PT Start Time  1435    PT Stop Time  1514    PT Time Calculation (min)  39 min    Activity Tolerance  Patient tolerated treatment well    Behavior During Therapy  [facility] for tasks assessed/performed       Past Medical History:  Diagnosis Date  . Depression   . History of chicken pox   . Tuberculosis    Received 9 Months of Treatement in 2006  . Typhoid fever   . Varicella    As a child     History reviewed. No pertinent surgical history.  There were no vitals filed for this visit.  Subjective Assessment - 07/07/18 0847    Subjective  Pt states she is doing better with exercises, but still has pain in low back.     Limitations  Sitting;Lifting;Standing;Walking;House hold activities    Patient Stated Goals  decreased pain    Currently in Pain?  Yes    Pain Score  5     Pain Location  Back    Pain Orientation  Right;Left;Lower    Pain Descriptors / Indicators  Headache;Shooting    Pain Type  Acute pain    Pain Onset  More than a month ago    Pain Frequency  Intermittent                       [facility] Adult PT Treatment/Exercise - 07/06/18 1446      Posture/Postural Control   Posture Comments  Seated: poor posture, with rounded back and shoulders;  Standing: poor posture: increased lumbar lordosis and anterior pelvic tilt;      Exercises   Exercises  Lumbar      Lumbar Exercises: Stretches   Active Hamstring Stretch  2 reps;30 seconds    Single Knee to Chest Stretch  --    Pelvic Tilt  20 reps     Piriformis Stretch  --    Piriformis Stretch Limitations  --      Lumbar Exercises: Aerobic   Stationary Bike  L1 x 6  min      Lumbar Exercises: Standing   Row  20 reps    Theraband Level (Row)  Level 2 (Red)    Other Standing Lumbar Exercises  Practice maintaining neutral pelvis with TA in standing position x5;       Lumbar Exercises: Supine   Ab Set  10 reps    Clam  20 reps    Clam Limitations  RTB with TA    Bent Knee Raise  20 reps    Bent Knee Raise Limitations  with TA      Lumbar Exercises: Sidelying   Hip Abduction  Both;20 reps      Manual Therapy   Manual Therapy  Manual Traction;Muscle Energy Technique    Manual Traction  --    Muscle Energy Technique  --  PT Short Term Goals - 06/22/18 1408      PT SHORT TERM GOAL #1   Title  Pt to report decreased pain in low back to 4/10 with activity     Time  2    Period  Weeks    Status  New    Target Date  07/06/18      PT SHORT TERM GOAL #2   Title  Pt to be independent with initial HEp     Time  2    Period  Weeks    Status  New    Target Date  07/06/18        PT Long Term Goals - 06/22/18 1409      PT LONG TERM GOAL #1   Title  Pt to be independent with long term/final HEP for lumbar stabilization    Time  6    Period  Weeks    Status  New    Target Date  08/03/18      PT LONG TERM GOAL #2   Title  Pt to report decreased pain in lumbar region to 0-2/10 with activity and child care    Time  6    Period  Weeks    Status  New    Target Date  08/03/18      PT LONG TERM GOAL #3   Title  Pt to demo improved strength of hips and core to at least 4+/5, with minimal postural changes seen with stabilization exercises.     Time  6    Period  Weeks    Status  New    Target Date  08/03/18      PT LONG TERM GOAL #4   Title  Pt to demo ability to self correct posture in clinic, and verbalize proper posture for child care.     Time  6    Period  Weeks    Status  New    Target  Date  08/03/18            Plan - 07/07/18 0848    Clinical Impression Statement  Ther ex reviewed and progressed for core strength and stabilization.  Pt with increased pain when releasing TA contraction, when pelvis goes back to anterior tilt. Pt with tendency for anterior tilt in standing as well. Discussed neurtral pelvis with activity, education done on posture and posture with IADSLs and child care.Pt with improving ability for ther ex, but states continued pain with activity.     Rehab Potential  Good    PT Frequency  2x / week    PT Duration  6 weeks    PT Treatment/Interventions  ADLs/Self Care Home Management;Cryotherapy;Electrical Stimulation;Iontophoresis 4mg /ml Dexamethasone;Moist Heat;Therapeutic activities;Functional mobility training;Stair training;Gait training;Ultrasound;Therapeutic exercise;Balance training;Neuromuscular re-education;Patient/family education;Dry needling;Passive range of motion;Manual techniques;Taping    Consulted and Agree with Plan of Care  Patient       Patient will benefit from skilled therapeutic intervention in order to improve the following deficits and impairments:  Decreased endurance, Pain, Decreased strength, Decreased activity tolerance, Decreased mobility, Increased muscle spasms, Improper body mechanics, Decreased range of motion, Hypermobility, Impaired flexibility  Visit Diagnosis: Acute bilateral low back pain without sciatica  Muscle weakness (generalized)     Problem List There are no active problems to display for this patient.  Sedalia Muta, PT, DPT 8:54 AM  07/07/18    [facility] [address] [place], [place], [address] Phone: [phone]   Fax:  [phone]  Name: Jenny Lane MRN: [id number] Date of Birth: Aug 09, 1986

## 2018-07-13 ENCOUNTER — Ambulatory Visit (INDEPENDENT_AMBULATORY_CARE_PROVIDER_SITE_OTHER): Payer: 59 | Admitting: Physical Therapy

## 2018-07-13 ENCOUNTER — Encounter: Payer: Self-pay | Admitting: Physical Therapy

## 2018-07-13 DIAGNOSIS — M6281 Muscle weakness (generalized): Secondary | ICD-10-CM

## 2018-07-13 DIAGNOSIS — M545 Low back pain, unspecified: Secondary | ICD-10-CM

## 2018-07-13 NOTE — Therapy (Signed)
[facility] [address] [place], [place], [address] Phone: [phone]   Fax:  [phone]  Physical Therapy Treatment  Patient Details  Name: Jenny Lane MRN: [id number] Date of Birth: February 11, 1986 Referring Provider (PT): Orland Mustard   Encounter Date: 07/13/2018  PT End of Session - 07/13/18 1353    Visit Number  4    Number of Visits  12    Date for PT Re-Evaluation  08/03/18    Authorization Type  UHC    PT Start Time  1347    PT Stop Time  1432    PT Time Calculation (min)  45 min    Activity Tolerance  Patient tolerated treatment well    Behavior During Therapy  [facility] for tasks assessed/performed       Past Medical History:  Diagnosis Date  . Depression   . History of chicken pox   . Tuberculosis    Received 9 Months of Treatement in 2006  . Typhoid fever   . Varicella    As a child     History reviewed. No pertinent surgical history.  There were no vitals filed for this visit.  Subjective Assessment - 07/13/18 1350    Subjective  Pt states she is doing better, but some things are still very painful, getting up/down, carrying baby, she states pain up to 6/10 daily.     Patient Stated Goals  Decreased pain     Currently in Pain?  Yes    Pain Score  6     Pain Location  Back    Pain Orientation  Right;Left;Lower    Pain Descriptors / Indicators  Aching;Shooting    Pain Type  Acute pain    Pain Onset  More than a month ago    Pain Frequency  Intermittent    Aggravating Factors   Carrying for baby, doing exercises.     Pain Relieving Factors  sleeping.                        [facility] Adult PT Treatment/Exercise - 07/13/18 1354      Posture/Postural Control   Posture Comments  --      Exercises   Exercises  Lumbar      Lumbar Exercises: Stretches   Active Hamstring Stretch  --    Pelvic Tilt  --      Lumbar Exercises: Aerobic   Stationary Bike  L1 x 6  min      Lumbar Exercises: Standing    Row  --    Theraband Level (Row)  --    Other Standing Lumbar Exercises  HIp abd and ext 2x10 each bil;      Other Standing Lumbar Exercises  UE flex in standing with RTB, with practice for TA;       Lumbar Exercises: Supine   Ab Set  10 reps    Clam  20 reps    Clam Limitations  RTB with TA    Bent Knee Raise  20 reps    Bent Knee Raise Limitations  with TA    Straight Leg Raise  10 reps    Straight Leg Raises Limitations  2x5 bil wiht TA      Lumbar Exercises: Sidelying   Hip Abduction  Both;20 reps      Manual Therapy   Manual Therapy  --             PT Education -  07/13/18 1431    Education Details  HEP updated.     Person(s) Educated  Patient    Methods  Explanation    Comprehension  Verbalized understanding       PT Short Term Goals - 07/13/18 1354      PT SHORT TERM GOAL #1   Title  Pt to report decreased pain in low back to 4/10 with activity     Time  2    Period  Weeks    Status  On-going      PT SHORT TERM GOAL #2   Title  Pt to be independent with initial HEp     Time  2    Period  Weeks    Status  Achieved        PT Long Term Goals - 06/22/18 1409      PT LONG TERM GOAL #1   Title  Pt to be independent with long term/final HEP for lumbar stabilization    Time  6    Period  Weeks    Status  New    Target Date  08/03/18      PT LONG TERM GOAL #2   Title  Pt to report decreased pain in lumbar region to 0-2/10 with activity and child care    Time  6    Period  Weeks    Status  New    Target Date  08/03/18      PT LONG TERM GOAL #3   Title  Pt to demo improved strength of hips and core to at least 4+/5, with minimal postural changes seen with stabilization exercises.     Time  6    Period  Weeks    Status  New    Target Date  08/03/18      PT LONG TERM GOAL #4   Title  Pt to demo ability to self correct posture in clinic, and verbalize proper posture for child care.     Time  6    Period  Weeks    Status  New    Target Date   08/03/18            Plan - 07/13/18 1539    Clinical Impression Statement  Focus on TA contraction and core stabilization today. Pt finally able to correctly contract abdominals with ther ex. Pt notes increased pain at times during treatment, during transition times, sit to stand or supine to sit, as well as rolling over. Pt continues to require max cueing for correct performance of exercises. She states increased pain with pelvic tilts at home, updated HEP with more stabilization and no pelvic tilts. Pt to benefit from continued education on posture and strength and stabilization.     Rehab Potential  Good    PT Frequency  2x / week    PT Duration  6 weeks    PT Treatment/Interventions  ADLs/Self Care Home Management;Cryotherapy;Electrical Stimulation;Iontophoresis 4mg /ml Dexamethasone;Moist Heat;Therapeutic activities;Functional mobility training;Stair training;Gait training;Ultrasound;Therapeutic exercise;Balance training;Neuromuscular re-education;Patient/family education;Dry needling;Passive range of motion;Manual techniques;Taping    Consulted and Agree with Plan of Care  Patient       Patient will benefit from skilled therapeutic intervention in order to improve the following deficits and impairments:  Decreased endurance, Pain, Decreased strength, Decreased activity tolerance, Decreased mobility, Increased muscle spasms, Improper body mechanics, Decreased range of motion, Hypermobility, Impaired flexibility  Visit Diagnosis: Acute bilateral low back pain without sciatica  Muscle weakness (generalized)     Problem  List There are no active problems to display for this patient.   Sedalia Muta 07/13/2018, 3:42 PM  [facility] [address] [place], [place], [address] Phone: [phone]   Fax:  [phone]  Name: Jenny Lane MRN: [id number] Date of Birth: 29-Sep-1985

## 2018-07-13 NOTE — Patient Instructions (Signed)
Access Code: [email or web address]  URL: [email or web address]  Date: 07/13/2018  Prepared by: Sedalia Muta   Exercises  Supine Single Knee to Chest - 3 reps - 30 hold - 2x daily  Supine Transversus Abdominis Bracing - Hands on Ground - 10 reps - 5 hold - 2x daily  Supine March - 10 reps - 2 sets - 1x daily  Hooklying Isometric Clamshell - 10 reps - 2 sets - 1x daily  Seated Hamstring Stretch - 3 reps - 30 hold - 2x daily  Sidelying Hip Abduction - 10 reps - 2 sets - 1x daily  Supine Bridge - 10 reps - 1 sets - 1x daily

## 2018-07-20 ENCOUNTER — Ambulatory Visit (INDEPENDENT_AMBULATORY_CARE_PROVIDER_SITE_OTHER): Payer: 59 | Admitting: Physical Therapy

## 2018-07-20 ENCOUNTER — Encounter: Payer: Self-pay | Admitting: Physical Therapy

## 2018-07-20 DIAGNOSIS — M545 Low back pain, unspecified: Secondary | ICD-10-CM

## 2018-07-20 DIAGNOSIS — M6281 Muscle weakness (generalized): Secondary | ICD-10-CM

## 2018-07-20 NOTE — Therapy (Signed)
[facility] [address] [place], [place], [address] Phone: [phone]   Fax:  [phone]  Physical Therapy Treatment  Patient Details  Name: Jenny Lane MRN: [id number] Date of Birth: 14-Mar-1986 Referring Provider (PT): Orland Mustard   Encounter Date: 07/20/2018  PT End of Session - 07/20/18 1401    Visit Number  5    Number of Visits  12    Date for PT Re-Evaluation  08/03/18    Authorization Type  UHC    PT Start Time  1355    PT Stop Time  1443    PT Time Calculation (min)  48 min    Activity Tolerance  Patient tolerated treatment well    Behavior During Therapy  [facility] for tasks assessed/performed       Past Medical History:  Diagnosis Date  . Depression   . History of chicken pox   . Tuberculosis    Received 9 Months of Treatement in 2006  . Typhoid fever   . Varicella    As a child     History reviewed. No pertinent surgical history.  There were no vitals filed for this visit.  Subjective Assessment - 07/20/18 1358    Subjective  Pt states some improvements in pain, improved with sitting and small amount of bending. She still has pain with moving car seat, holding baby, and going up/down stairs.     Patient Stated Goals  Decreased pain     Currently in Pain?  Yes    Pain Score  5     Pain Location  Back    Pain Orientation  Left;Right;Lower    Pain Descriptors / Indicators  Aching;Shooting    Pain Type  Acute pain    Pain Onset  More than a month ago    Pain Frequency  Intermittent    Aggravating Factors   Carrying baby, stairs,     Pain Relieving Factors  sleep                       [facility] Adult PT Treatment/Exercise - 07/20/18 1405      Exercises   Exercises  Lumbar      Lumbar Exercises: Stretches   Other Lumbar Stretch Exercise  childs pose 30 sec x3;       Lumbar Exercises: Aerobic   Stationary Bike  L1 x 6  min      Lumbar Exercises: Standing   Row  20 reps    Theraband Level  (Row)  Level 2 (Red)    Other Standing Lumbar Exercises  HIp abd , march, and ext 2x10 each bil;  Step up x 10 bil;     Other Standing Lumbar Exercises  UE flex in standing with RTB, with practice for TA;       Lumbar Exercises: Supine   Ab Set  --    Clam  --    Clam Limitations  --    Bent Knee Raise  --    Bent Knee Raise Limitations  --    Straight Leg Raise  10 reps    Straight Leg Raises Limitations  2x10 bil wiht TA      Lumbar Exercises: Sidelying   Clam  20 reps;Both    Hip Abduction  --      Lumbar Exercises: Quadruped   Opposite Arm/Leg Raise  15 reps    Opposite Arm/Leg Raise Limitations  LE only  PT Short Term Goals - 07/13/18 1354      PT SHORT TERM GOAL #1   Title  Pt to report decreased pain in low back to 4/10 with activity     Time  2    Period  Weeks    Status  On-going      PT SHORT TERM GOAL #2   Title  Pt to be independent with initial HEp     Time  2    Period  Weeks    Status  Achieved        PT Long Term Goals - 06/22/18 1409      PT LONG TERM GOAL #1   Title  Pt to be independent with long term/final HEP for lumbar stabilization    Time  6    Period  Weeks    Status  New    Target Date  08/03/18      PT LONG TERM GOAL #2   Title  Pt to report decreased pain in lumbar region to 0-2/10 with activity and child care    Time  6    Period  Weeks    Status  New    Target Date  08/03/18      PT LONG TERM GOAL #3   Title  Pt to demo improved strength of hips and core to at least 4+/5, with minimal postural changes seen with stabilization exercises.     Time  6    Period  Weeks    Status  New    Target Date  08/03/18      PT LONG TERM GOAL #4   Title  Pt to demo ability to self correct posture in clinic, and verbalize proper posture for child care.     Time  6    Period  Weeks    Status  New    Target Date  08/03/18            Plan - 07/20/18 1502    Clinical Impression Statement  Focus continues to be  on strengthening, stabilization, and ensuring core activation and upright posture with all activities. Pt continues to be very challenged with most activities. She is improving with ability for reps, challenged with increased difficulty of exercises in quadruped today. Pt showing improvments with pain, discussed continued need for HEp , strength, and posture.     Rehab Potential  Good    PT Frequency  2x / week    PT Duration  6 weeks    PT Treatment/Interventions  ADLs/Self Care Home Management;Cryotherapy;Electrical Stimulation;Iontophoresis 4mg /ml Dexamethasone;Moist Heat;Therapeutic activities;Functional mobility training;Stair training;Gait training;Ultrasound;Therapeutic exercise;Balance training;Neuromuscular re-education;Patient/family education;Dry needling;Passive range of motion;Manual techniques;Taping    Consulted and Agree with Plan of Care  Patient       Patient will benefit from skilled therapeutic intervention in order to improve the following deficits and impairments:  Decreased endurance, Pain, Decreased strength, Decreased activity tolerance, Decreased mobility, Increased muscle spasms, Improper body mechanics, Decreased range of motion, Hypermobility, Impaired flexibility  Visit Diagnosis: Acute bilateral low back pain without sciatica  Muscle weakness (generalized)     Problem List There are no active problems to display for this patient.   Sedalia MutaLauren Mirranda Monrroy, PT, DPT 3:05 PM  07/20/18    [facility] [address] [place], [place], [address] Phone: [phone]   Fax:  [phone]  Name: Jenny Lane MRN: [id number] Date of Birth: 02/20/1986

## 2018-07-27 ENCOUNTER — Encounter: Payer: Self-pay | Admitting: Physical Therapy

## 2018-07-27 ENCOUNTER — Ambulatory Visit (INDEPENDENT_AMBULATORY_CARE_PROVIDER_SITE_OTHER): Payer: 59 | Admitting: Physical Therapy

## 2018-07-27 DIAGNOSIS — M6281 Muscle weakness (generalized): Secondary | ICD-10-CM

## 2018-07-27 DIAGNOSIS — M545 Low back pain, unspecified: Secondary | ICD-10-CM

## 2018-07-27 NOTE — Patient Instructions (Signed)
Access Code: [email or web address]  URL: [email or web address]  Date: 07/27/2018  Prepared by: Sedalia MutaLauren Merranda Bolls   Exercises  Supine Single Knee to Chest - 3 reps - 30 hold - 2x daily  Child's Pose Stretch - 3 reps - 30 hold - 2x daily  Supine Transversus Abdominis Bracing - Hands on Ground - 10 reps - 5 hold - 2x daily  Supine March - 10 reps - 2 sets - 1x daily  Hooklying Isometric Clamshell - 10 reps - 2 sets - 1x daily  Seated Hamstring Stretch - 3 reps - 30 hold - 2x daily  Sidelying Hip Abduction - 10 reps - 2 sets - 1x daily  Straight Leg Raise - 10 reps - 2 sets - 1x daily  Supine Bridge - 10 reps - 2 sets - 1x daily  Quadruped Alternating Leg Extensions - 10 reps - 2 sets - 1x daily  Standing Hip Abduction - 10 reps - 2 sets - 1x daily

## 2018-07-28 NOTE — Therapy (Signed)
[facility] [address] [place], [place], [address] Phone: [phone]   Fax:  [phone]  Physical Therapy Treatment  Patient Details  Name: Jenny Lane MRN: [id number] Date of Birth: 04/26/1986 Referring Provider (PT): Orland MustardAllison Wolfe   Encounter Date: 07/27/2018  PT End of Session - 07/27/18 1405    Visit Number  6    Number of Visits  12    Date for PT Re-Evaluation  08/03/18    Authorization Type  UHC    PT Start Time  1353    PT Stop Time  1432    PT Time Calculation (min)  39 min    Activity Tolerance  Patient tolerated treatment well    Behavior During Therapy  [facility] assessed/per[facility]       Past Medical History:  Diagnosis Date  . Depression   . History of chicken pox   . Tuberculosis    Received 9 Months of Treatement in 2006  . Typhoid fever   . Varicella    As a child     History reviewed. No pertinent surgical history.  There were no vitals filed for this visit.  Subjective Assessment - 07/27/18 1404    Subjective  Pt states back was doing well, but now her R hip/groin has been very painful for a couple days, no incident to report.     Currently in Pain?  Yes    Pain Score  7     Pain Location  Hip    Pain Orientation  Right    Pain Descriptors / Indicators  Aching;Shooting    Pain Type  Acute pain    Pain Onset  In the past 7 days    Pain Frequency  Intermittent                       [facility] Adult PT Treatment/Exercise - 07/27/18 1409      Exercises   Exercises  Lumbar      Lumbar Exercises: Stretches   Single Knee to Chest Stretch  3 reps;30 seconds    Other Lumbar Stretch Exercise  --      Lumbar Exercises: Aerobic   Stationary Bike  L1 x 6  min      Lumbar Exercises: Standing   Row  --    Theraband Level (Row)  --    Other Standing Lumbar Exercises  HIp abd ,  ext 2x10 each bil;      Other Standing Lumbar Exercises  --      Lumbar Exercises: Supine   Clam  20  reps    Clam Limitations  GTB    Straight Leg Raise  --    Straight Leg Raises Limitations  --      Lumbar Exercises: Sidelying   Clam  --    Hip Abduction  Both;20 reps      Lumbar Exercises: Quadruped   Opposite Arm/Leg Raise  --    Opposite Arm/Leg Raise Limitations  --      Manual Therapy   Manual Therapy  Joint mobilization    Joint Mobilization  S/L pelvis mobs, to increase anterior rotation.     Muscle Energy Technique  On R, to increase R anterior rotation;               PT Education - 07/27/18 1430    Education Details  HEP updated.     Person(s) Educated  Patient  Methods  Explanation    Comprehension  Verbalized understanding       PT Short Term Goals - 07/13/18 1354      PT SHORT TERM GOAL #1   Title  Pt to report decreased pain in low back to 4/10 with activity     Time  2    Period  Weeks    Status  On-going      PT SHORT TERM GOAL #2   Title  Pt to be independent with initial HEp     Time  2    Period  Weeks    Status  Achieved        PT Long Term Goals - 06/22/18 1409      PT LONG TERM GOAL #1   Title  Pt to be independent with long term/final HEP for lumbar stabilization    Time  6    Period  Weeks    Status  New    Target Date  08/03/18      PT LONG TERM GOAL #2   Title  Pt to report decreased pain in lumbar region to 0-2/10 with activity and child care    Time  6    Period  Weeks    Status  New    Target Date  08/03/18      PT LONG TERM GOAL #3   Title  Pt to demo improved strength of hips and core to at least 4+/5, with minimal postural changes seen with stabilization exercises.     Time  6    Period  Weeks    Status  New    Target Date  08/03/18      PT LONG TERM GOAL #4   Title  Pt to demo ability to self correct posture in clinic, and verbalize proper posture for child care.     Time  6    Period  Weeks    Status  New    Target Date  08/03/18            Plan - 07/28/18 1512    Clinical Impression  Statement  Pt states improving pain in back, but does have times of increased pain, when lifting child or car seat. Pt with variable pain locations, today very painful in R groin, manual done to improve pelvic alignment. Pt continues to have significant weakness and instability with core and will benefit from continued strengthening and education on HEP.     Rehab Potential  Good    PT Frequency  2x / week    PT Duration  6 weeks    PT Treatment/Interventions  ADLs/Self Care Home Management;Cryotherapy;Electrical Stimulation;Iontophoresis 4mg /ml Dexamethasone;Moist Heat;Therapeutic activities;Functional mobility training;Stair training;Gait training;Ultrasound;Therapeutic exercise;Balance training;Neuromuscular re-education;Patient/family education;Dry needling;Passive range of motion;Manual techniques;Taping    Consulted and Agree with Plan of Care  Patient       Patient will benefit from skilled therapeutic intervention in order to improve the following deficits and impairments:  Decreased endurance, Pain, Decreased strength, Decreased activity tolerance, Decreased mobility, Increased muscle spasms, Improper body mechanics, Decreased range of motion, Hypermobility, Impaired flexibility  Visit Diagnosis: Acute bilateral low back pain without sciatica  Muscle weakness (generalized)     Problem List There are no active problems to display for this patient.  Sedalia Muta, PT, DPT 3:16 PM  07/28/18    [facility] [address] [place], [place], [address] Phone: [phone]   Fax:  [phone]  Name: Jenny Lane  MRN: [id number] Date of Birth: 1985/09/27

## 2018-08-03 ENCOUNTER — Ambulatory Visit (INDEPENDENT_AMBULATORY_CARE_PROVIDER_SITE_OTHER): Payer: 59 | Admitting: Physical Therapy

## 2018-08-03 ENCOUNTER — Encounter: Payer: Self-pay | Admitting: Physical Therapy

## 2018-08-03 DIAGNOSIS — M545 Low back pain, unspecified: Secondary | ICD-10-CM

## 2018-08-03 DIAGNOSIS — M6281 Muscle weakness (generalized): Secondary | ICD-10-CM | POA: Diagnosis not present

## 2018-08-03 NOTE — Therapy (Signed)
[facility] [address] [place], [place], [address] Phone: [phone]   Fax:  [phone]  Physical Therapy Treatment/Discharge   Patient Details  Name: Jenny Lane MRN: [id number] Date of Birth: 1986-03-09 Referring Provider (PT): Orma Flaming   Encounter Date: 08/03/2018  PT End of Session - 08/03/18 0825    Visit Number  7    Number of Visits  12    Date for PT Re-Evaluation  08/03/18    Authorization Type  UHC    PT Start Time  0807    PT Stop Time  0847    PT Time Calculation (min)  40 min    Activity Tolerance  Patient tolerated treatment well    Behavior During Therapy  [facility] for tasks assessed/performed       Past Medical History:  Diagnosis Date  . Depression   . History of chicken pox   . Tuberculosis    Received 9 Months of Treatement in 2006  . Typhoid fever   . Varicella    As a child     History reviewed. No pertinent surgical history.  There were no vitals filed for this visit.  Subjective Assessment - 08/03/18 0821    Subjective  Pt states back is still hurting at times, Hip pain that she had last week is resolved.     Patient Stated Goals  Decreased pain     Currently in Pain?  Yes    Pain Score  6     Pain Location  Back    Pain Orientation  Right    Pain Descriptors / Indicators  Aching    Pain Type  Acute pain    Pain Onset  More than a month ago    Pain Frequency  Intermittent    Aggravating Factors   Getting up and down, lifting, carrying child or equipment                       [facility] Adult PT Treatment/Exercise - 08/03/18 0001      Exercises   Exercises  Lumbar      Lumbar Exercises: Stretches   Single Knee to Chest Stretch  3 reps;30 seconds    Other Lumbar Stretch Exercise  childs pose 30 sec x2 ;       Lumbar Exercises: Aerobic   Stationary Bike  L1 x 6  min      Lumbar Exercises: Standing   Row  20 reps    Theraband Level (Row)  Level 2 (Red)    Other  Standing Lumbar Exercises  HIp abd , March x20;       Lumbar Exercises: Supine   Clam  --    Clam Limitations  --    Straight Leg Raise  20 reps      Lumbar Exercises: Sidelying   Hip Abduction  Both;20 reps      Lumbar Exercises: Quadruped   Opposite Arm/Leg Raise  20 reps    Opposite Arm/Leg Raise Limitations  LE only      Manual Therapy   Manual Therapy  --    Joint Mobilization  --    Muscle Energy Technique  --             PT Education - 08/03/18 0825    Education Details  HEP reviewed.     Person(s) Educated  Patient    Methods  Explanation    Comprehension  Verbalized understanding  PT Short Term Goals - 08/03/18 1009      PT SHORT TERM GOAL #1   Title  Pt to report decreased pain in low back to 4/10 with activity     Time  2    Period  Weeks    Status  Partially Met      PT SHORT TERM GOAL #2   Title  Pt to be independent with initial HEp     Time  2    Period  Weeks    Status  Achieved        PT Long Term Goals - 08/03/18 1009      PT LONG TERM GOAL #1   Title  Pt to be independent with long term/final HEP for lumbar stabilization    Time  6    Period  Weeks    Status  Achieved      PT LONG TERM GOAL #2   Title  Pt to report decreased pain in lumbar region to 0-2/10 with activity and child care    Time  6    Period  Weeks    Status  Partially Met      PT LONG TERM GOAL #3   Title  Pt to demo improved strength of hips and core to at least 4+/5, with minimal postural changes seen with stabilization exercises.     Time  6    Period  Weeks    Status  Partially Met      PT LONG TERM GOAL #4   Title  Pt to demo ability to self correct posture in clinic, and verbalize proper posture for child care.     Time  6    Period  Weeks    Status  Achieved            Plan - 08/03/18 2117    Clinical Impression Statement  Pt has made improvements with pain and strength. Pt states that she has much less pain, but still rates at 6/10 at  times, she has significant decrease in frequency of pain. Pt has shown improvments in strengthening of core and hips, and has good understanding of HEP. She has been able to increase difficulty of exercise, as well as reps, but is still challenged with most activities. She continues to have significant weakness and instability of hips and core, that are likely contributing to continued pain. Pt requestes to stop PT at this time, and continue with HEP. HEP reviewed today. Will d/c at this time.     Rehab Potential  Good    PT Frequency  2x / week    PT Duration  6 weeks    PT Treatment/Interventions  ADLs/Self Care Home Management;Cryotherapy;Electrical Stimulation;Iontophoresis 5m/ml Dexamethasone;Moist Heat;Therapeutic activities;Functional mobility training;Stair training;Gait training;Ultrasound;Therapeutic exercise;Balance training;Neuromuscular re-education;Patient/family education;Dry needling;Passive range of motion;Manual techniques;Taping    Consulted and Agree with Plan of Care  Patient       Patient will benefit from skilled therapeutic intervention in order to improve the following deficits and impairments:  Decreased endurance, Pain, Decreased strength, Decreased activity tolerance, Decreased mobility, Increased muscle spasms, Improper body mechanics, Decreased range of motion, Hypermobility, Impaired flexibility  Visit Diagnosis: Acute bilateral low back pain without sciatica  Muscle weakness (generalized)     Problem List There are no active problems to display for this patient.  LLyndee Hensen PT, DPT 9:27 PM  08/03/18   [place] [address]: [phone]  Fax:  [phone]  Name: Jenny Lane MRN: [id number] Date of Birth: [birth year]     PHYSICAL THERAPY DISCHARGE SUMMARY  Visits from Start of Care:7  Plan: Patient agrees to discharge.  Patient goals were partially met.  Patient is being discharged due to being pleased with the current functional level.  ?????       Lyndee Hensen, PT, DPT 9:27 PM  08/03/18

## 2019-10-25 ENCOUNTER — Telehealth: Payer: Self-pay | Admitting: Family Medicine

## 2019-10-25 ENCOUNTER — Encounter (HOSPITAL_COMMUNITY): Payer: Self-pay

## 2019-10-25 ENCOUNTER — Emergency Department (HOSPITAL_COMMUNITY)
Admission: EM | Admit: 2019-10-25 | Discharge: 2019-10-25 | Disposition: A | Discharge status: Home / Self Care | Payer: 59 | Attending: Emergency Medicine | Admitting: Emergency Medicine

## 2019-10-25 ENCOUNTER — Emergency Department (HOSPITAL_COMMUNITY): Payer: 59

## 2019-10-25 ENCOUNTER — Other Ambulatory Visit: Payer: Self-pay

## 2019-10-25 DIAGNOSIS — R0602 Shortness of breath: Secondary | ICD-10-CM | POA: Diagnosis not present

## 2019-10-25 DIAGNOSIS — R079 Chest pain, unspecified: Secondary | ICD-10-CM

## 2019-10-25 DIAGNOSIS — R0789 Other chest pain: Secondary | ICD-10-CM | POA: Insufficient documentation

## 2019-10-25 LAB — BASIC METABOLIC PANEL
Anion gap: 8 (ref 5–15)
BUN: 10 mg/dL (ref 6–20)
CO2: 26 mmol/L (ref 22–32)
Calcium: 9.5 mg/dL (ref 8.9–10.3)
Chloride: 104 mmol/L (ref 98–111)
Creatinine, Ser: 0.68 mg/dL (ref 0.44–1.00)
GFR calc Af Amer: >60 mL/min (ref >60)
GFR calc non Af Amer: >60 mL/min (ref >60)
Glucose, Bld: 113 mg/dL — ABNORMAL HIGH (ref 70–99)
Potassium: 3.9 mmol/L (ref 3.5–5.1)
Sodium: 138 mmol/L (ref 135–145)

## 2019-10-25 LAB — CBC
HCT: 39.9 % (ref 36.0–46.0)
Hemoglobin: 13 g/dL (ref 12.0–15.0)
MCH: 29.1 pg (ref 26.0–34.0)
MCHC: 32.6 g/dL (ref 30.0–36.0)
MCV: 89.5 fL (ref 80.0–100.0)
Platelets: 272 10*3/uL (ref 150–400)
RBC: 4.46 MIL/uL (ref 3.87–5.11)
RDW: 13.2 % (ref 11.5–15.5)
WBC: 9.1 10*3/uL (ref 4.0–10.5)
nRBC: 0 % (ref 0.0–0.2)

## 2019-10-25 LAB — I-STAT BETA HCG BLOOD, ED (MC, WL, AP ONLY): I-stat hCG, quantitative: <5 m[IU]/mL (ref <5)

## 2019-10-25 LAB — TROPONIN I (HIGH SENSITIVITY): Troponin I (High Sensitivity): <2 ng/L (ref <18)

## 2019-10-25 MED ORDER — SODIUM CHLORIDE 0.9% FLUSH: 3.0000 mL | Freq: Once | INTRAVENOUS | Status: DC

## 2019-10-25 NOTE — ED Notes (Signed)
Pt ready to go home, wants to know how long it will take

## 2019-10-25 NOTE — ED Triage Notes (Signed)
Pt presents w/Left side chest pain and indagestion x1 day. Pt reports taking 1 tums yesterday w/no relief. PCP sent pt to ED for further evaluation

## 2019-10-25 NOTE — Telephone Encounter (Signed)
Chief Complaint CHEST PAIN (>=21 years) - pain, pressure, heaviness or tightness Reason for Call Symptomatic / Request for Health Information Initial Comment Caller states she has chest pains in left side since last night, taken Tums, alot of gas and burping, still with pain, and wondering what to do. Translation No Nurse Assessment Nurse: Milana Na, RN, Donnamarie Poag Date/Time (Eastern Time): 10/25/2019 8:27:07 AM Confirm and document reason for call. If symptomatic, describe symptoms. ---Caller states she has chest pains in left side since last night, taken Tums, a lot of gas and burping, still with pain, and wondering what to do. A few days a go had RUQ pain Pain in chest increases with lifting he 55 month old. Pain wakes her up from sleeping if she turns. Pain is 8/10. Has the patient had close contact with a person known or suspected to have the novel coronavirus illness OR traveled / lives in area with major community spread (including international travel) in the last 14 days from the onset of symptoms? * If Asymptomatic, screen for exposure and travel within the last 14 days. ---No Does the patient have any new or worsening symptoms? ---Yes Will a triage be completed? ---Yes Related visit to physician within the last 2 weeks? ---No Does the PT have any chronic conditions? (i.e. diabetes, asthma, this includes High risk factors for pregnancy, etc.) ---No Is the patient pregnant or possibly pregnant? (Ask all females between the ages of 72-55) ---No Is this a behavioral health or substance abuse call? ---No Guidelines Guideline Title Affirmed Question Affirmed Notes Nurse Date/Time (Eastern Time) Chest Pain [1] Chest pain lasts > 5 minutes AND [2] Lynnda Shields 10/25/2019 8:30:07 AM PLEASE NOTE: All timestamps contained within this report are represented as [place] Standard Time. CONFIDENTIALTY NOTICE: This fax transmission is intended only for the addressee. It contains  information that is legally privileged, confidential or otherwise protected from use or disclosure. If you are not the intended recipient, you are strictly prohibited from reviewing, disclosing, copying using or disseminating any of this information or taking any action in reliance on or regarding this information. If you have received this fax in error, please notify us immediately by telephone so that we can arrange for its return to [place]. Phone: [phone], Toll-Free: [phone], Fax: [phone] Page: 2 of 2 Call Id: [id number] Guidelines Guideline Title Affirmed Question Affirmed Notes Nurse Date/Time Lamount Cohen Time) described as crushing, pressure-like, or heavy Disp. Time Lamount Cohen Time) Disposition Final User 10/25/2019 8:23:18 AM Send to Urgent Queue Kendall Flack 10/25/2019 8:34:52 AM Send To RN Personal Milana Na, RN, Donnamarie Poag 10/25/2019 8:41:08 AM 911 Outcome Documentation Gilmartin, RN, Donnamarie Poag Reason: Caller states she will call now. 10/25/2019 8:32:55 AM Call EMS 911 Now Yes Gilmartin, RN, Terrance Mass Disagree/Comply Comply Caller Understands Yes PreDisposition Call Doctor Care Advice Given Per Guideline CALL EMS 911 NOW: * Immediate medical attention is needed. You need to hang up and call 911 (or an ambulance). CARE ADVICE given per Chest Pain (Adult) guideline. Comments User: Dorothy Spark, RN Date/Time Lamount Cohen Time): 10/25/2019 8:34:26 AM Caller states that 1 doctor told her she had ischemia shown on EKG and another told her it was ok.. Advised to call 911 as pain is described as tightness and pressure 8/10.

## 2019-10-25 NOTE — Discharge Instructions (Addendum)
Please follow-up with your primary doctor regarding symptoms you are experiencing today.  If you feel you are having worsening abdominal pain, chest pain, difficulty breathing, fever, return to ER for reassessment.

## 2019-10-25 NOTE — Telephone Encounter (Signed)
Spoke with patient, she is currently in the hospital. Pt will keep [place] updated.

## 2019-10-25 NOTE — ED Provider Notes (Signed)
[facility] EMERGENCY DEPARTMENT Provider Note   CSN: [id number] Arrival date & time: 10/25/19  0935     History Chief Complaint  Patient presents with  . Chest Pain    Jenny Lane is a 34 y.o. female.  Presents ER chief complaint chest pain.  Patient states pain started yesterday at some point, has been relatively constant since that time.  Has burning sensation, sometimes sharp.  Not associated with exertion.  Associated with shortness of breath.  Tried taking Tums with no improvement yesterday.  Primary care doctor's office recommended going to ER for assessment.  Denies any chronic medical problems, no coronary artery disease history, no family history CAD, not on birth control, no DVT/PE history.  HPI     Past Medical History:  Diagnosis Date  . Depression   . History of chicken pox   . Tuberculosis    Received 9 Months of Treatement in 2006  . Typhoid fever   . Varicella    As a child     There are no problems to display for this patient.   History reviewed. No pertinent surgical history.   OB History    Gravida  2   Para  2   Term  2   Preterm      AB      Living  2     SAB      TAB      Ectopic      Multiple  0   Live Births  2           Family History  Problem Relation Age of Onset  . Diabetes Father     Social History   Tobacco Use  . Smoking status: Never Smoker  . Smokeless tobacco: Never Used  Substance Use Topics  . Alcohol use: No  . Drug use: No    Home Medications Prior to Admission medications   Medication Sig Start Date End Date Taking? Authorizing Provider  cetirizine (ZYRTEC) 10 MG tablet Take 10 mg by mouth daily.    [provider]  fluticasone (FLONASE) 50 MCG/ACT nasal spray Place 1 spray into both nostrils daily.    [provider]  guaiFENesin (MUCINEX) 600 MG 12 hr tablet Take by mouth 2 (two) times daily.    [provider]  ibuprofen (ADVIL,MOTRIN) 600 MG  tablet Take 1 tablet (600 mg total) by mouth every 8 (eight) hours as needed. 06/16/18   Orland Mustard, MD  prenatal vitamin w/FE, FA (NATACHEW) 29-1 MG CHEW chewable tablet Chew 1 tablet by mouth daily.     [provider]  sertraline (ZOLOFT) 50 MG tablet Take 50 mg by mouth daily.    [provider]    Allergies    Biaxin [clarithromycin], Cefdinir, Clindamycin/lincomycin, Sulfa antibiotics, Tetracyclines & related, and Ampicillin  Review of Systems   Review of Systems  Constitutional: Negative for chills and fever.  HENT: Negative for ear pain and sore throat.   Eyes: Negative for pain and visual disturbance.  Respiratory: Negative for cough and shortness of breath.   Cardiovascular: Positive for chest pain. Negative for palpitations.  Gastrointestinal: Negative for abdominal pain and vomiting.  Genitourinary: Negative for dysuria and hematuria.  Musculoskeletal: Negative for arthralgias and back pain.  Skin: Negative for color change and rash.  Neurological: Negative for seizures and syncope.  All other systems reviewed and are negative.   Physical Exam Updated Vital Signs BP 103/77 (BP Location: Right Arm)  Pulse 75   Temp 98.8 F (37.1 C) (Oral)   Resp 16   Ht 5\' 2"  (1.575 m)   Wt 68 kg   LMP 09/28/2019   SpO2 100%   BMI 27.44 kg/m   Physical Exam Vitals and nursing note reviewed.  Constitutional:      General: She is not in acute distress.    Appearance: She is well-developed.  HENT:     Head: Normocephalic and atraumatic.  Eyes:     Conjunctiva/sclera: Conjunctivae normal.  Cardiovascular:     Rate and Rhythm: Normal rate and regular rhythm.     Heart sounds: No murmur.  Pulmonary:     Effort: Pulmonary effort is normal. No respiratory distress.     Breath sounds: Normal breath sounds.  Chest:     Chest wall: No mass or deformity.  Abdominal:     Palpations: Abdomen is soft.     Tenderness: There is no abdominal tenderness.    Musculoskeletal:     Cervical back: Neck supple.     Right lower leg: No edema.     Left lower leg: No edema.  Skin:    General: Skin is warm and dry.  Neurological:     Mental Status: She is alert.     ED Results / Procedures / Treatments   Labs (all labs ordered are listed, but only abnormal results are displayed) Labs Reviewed  BASIC METABOLIC PANEL - Abnormal; Notable for the following components:      Result Value   Glucose, Bld 113 (*)    All other components within normal limits  CBC  I-STAT BETA HCG BLOOD, ED ([facility], [facility], [facility] ONLY)  TROPONIN I (HIGH SENSITIVITY)    EKG EKG Interpretation  Date/Time:  Wednesday October 25 2019 09:52:11 EST Ventricular Rate:  72 PR Interval:    QRS Duration: 97 QT Interval:  400 QTC Calculation: 438 R Axis:   77 Text Interpretation: Age not entered, assumed to be  34 years old for purpose of ECG interpretation Sinus rhythm Borderline T abnormalities, diffuse leads Confirmed by Madalyn Rob [phone]) on 10/25/2019 10:13:11 AM   Radiology DG Chest Portable 1 View  Result Date: 10/25/2019 CLINICAL DATA:  Chest pain EXAM: PORTABLE CHEST 1 VIEW COMPARISON:  None. FINDINGS: The heart size and mediastinal contours are within normal limits. Subtle heterogeneous airspace opacity of the left upper lobe. The visualized skeletal structures are unremarkable. IMPRESSION: Subtle heterogeneous airspace opacity of the left upper lobe. Differential considerations include both acute airspace disease and chronic sequelae of prior infection. Electronically Signed   By: Eddie Candle M.D.   On: 10/25/2019 10:46    Procedures Procedures (including critical care time)  Medications Ordered in ED Medications  sodium chloride flush (NS) 0.9 % injection 3 mL (3 mLs Intravenous Not Given 10/25/19 1059)    ED Course  I have reviewed the triage vital signs and the nursing notes.  Pertinent labs & imaging results that were available during my care of the  patient were reviewed by me and considered in my medical decision making (see chart for details).    MDM Rules/Calculators/A&P                     \ 34 year old presented to ER with chest pain.  On exam she was noted to be well-appearing, vital signs stable, EKG without ischemic changes, troponin undetectable.  No risk factors, given above work-up, doubt ACS.  No hypoxia, no tachypnea, no tachycardia,  doubt PE.  CXR without acute abnormality except radiologist noted there is subtle questionable airspace opacity left upper lobe.  Patient does not have cough, no white count, no fever, doubt pneumonia.  Recommend follow-up with primary doctor.  Return for recurrent or worsening symptoms.    After the discussed management above, the patient was determined to be safe for discharge.  The patient was in agreement with this plan and all questions regarding their care were answered.  ED return precautions were discussed and the patient will return to the ED with any significant worsening of condition.   Final Clinical Impression(s) / ED Diagnoses Final diagnoses:  Chest pain, unspecified type    Rx / DC Orders ED Discharge Orders    None       Milagros Loll, MD 10/25/19 1553

## 2019-10-25 NOTE — ED Notes (Signed)
Pt ambulated self efficiently to restroom with no difficulty. Pt returned safely to bedside. 

## 2019-10-26 DIAGNOSIS — K219 Gastro-esophageal reflux disease without esophagitis: Secondary | ICD-10-CM | POA: Insufficient documentation

## 2019-10-27 NOTE — Telephone Encounter (Signed)
Chief Complaint CHEST PAIN (>=21 years) - pain, pressure, heaviness or tightness Reason for Call Symptomatic / Request for Health Information Initial Comment Caller states she has been having chest pain since last night. Can she have something called in to help? Translation No Nurse Assessment Nurse: Doylene Canard, RN, Rinaldo Cloud Date/Time (Eastern Time): 10/26/2019 8:24:02 AM Confirm and document reason for call. If symptomatic, describe symptoms. ---Caller states she has been having chest pain that started day before yesterday, was seen in ER, assessed and discharged home. States she is burping a lot and pain increases with this. Rates pain at 7 and constant. States pain is in the middle and describes as if something is sitting on her chest. Has the patient had close contact with a person known or suspected to have the novel coronavirus illness OR traveled / lives in area with major community spread (including international travel) in the last 14 days from the onset of symptoms? * If Asymptomatic, screen for exposure and travel within the last 14 days. ---No Does the patient have any new or worsening symptoms? ---Yes Will a triage be completed? ---Yes Related visit to physician within the last 2 weeks? ---No Does the PT have any chronic conditions? (i.e. diabetes, asthma, this includes High risk factors for pregnancy, etc.) ---No Is the patient pregnant or possibly pregnant? (Ask all females between the ages of 92-55) ---No Is this a behavioral health or substance abuse call? ---No Guidelines Guideline Title Affirmed Question Affirmed Notes Nurse Date/Time (Eastern Time) Chest Pain [1] Chest pain lasts > 5 minutes AND [2] Doylene Canard, RN, Rinaldo Cloud 10/26/2019 8:27:01 AM PLEASE NOTE: All timestamps contained within this report are represented as [place] Standard Time. CONFIDENTIALTY NOTICE: This fax transmission is intended only for the addressee. It contains information that is legally privileged,  confidential or otherwise protected from use or disclosure. If you are not the intended recipient, you are strictly prohibited from reviewing, disclosing, copying using or disseminating any of this information or taking any action in reliance on or regarding this information. If you have received this fax in error, please notify us immediately by telephone so that we can arrange for its return to [place]. Phone: [phone], Toll-Free: [phone], Fax: [phone] Page: 2 of 2 Call Id: [id number] Guidelines Guideline Title Affirmed Question Affirmed Notes Nurse Date/Time Lamount Cohen Time) described as crushing, pressure-like, or heavy Disp. Time Lamount Cohen Time) Disposition Final User 10/26/2019 8:22:05 AM Send to Urgent Queue Gerhard Perches 10/26/2019 8:33:33 AM 911 Outcome Documentation Conner, RN, Rinaldo Cloud Reason: Caller states she will check and see if urgent care is open, does not want to call EMS

## 2019-10-27 NOTE — Telephone Encounter (Signed)
Pt stated she went to [facility] to be seen for chest pain. Asked pt if she wanted to schedule follow up appt and she refused.

## 2019-10-27 NOTE — Telephone Encounter (Signed)
We can put her in on Monday as well in acute spot.  Orland Mustard, MD [facility]

## 2019-10-30 ENCOUNTER — Encounter: Payer: Self-pay | Admitting: Family Medicine

## 2019-10-31 NOTE — Telephone Encounter (Signed)
Please Advise

## 2019-11-02 ENCOUNTER — Other Ambulatory Visit: Payer: Self-pay

## 2019-11-02 ENCOUNTER — Encounter: Payer: Self-pay | Admitting: Family Medicine

## 2019-11-02 ENCOUNTER — Ambulatory Visit (INDEPENDENT_AMBULATORY_CARE_PROVIDER_SITE_OTHER): Payer: 59

## 2019-11-02 DIAGNOSIS — Z23 Encounter for immunization: Secondary | ICD-10-CM

## 2019-12-07 ENCOUNTER — Encounter: Payer: Self-pay | Admitting: Family Medicine

## 2019-12-11 NOTE — Telephone Encounter (Signed)
Please schedule pt for new app for flank pain. This will be separate from Physical app. Pt will need to come back for CPE.  Thank You.

## 2019-12-11 NOTE — Telephone Encounter (Signed)
I have called patient and transferred to [facility] for triage.

## 2019-12-12 ENCOUNTER — Other Ambulatory Visit: Payer: Self-pay

## 2019-12-12 ENCOUNTER — Encounter: Payer: Self-pay | Admitting: Physician Assistant

## 2019-12-12 ENCOUNTER — Telehealth: Payer: Self-pay | Admitting: Family Medicine

## 2019-12-12 ENCOUNTER — Ambulatory Visit (INDEPENDENT_AMBULATORY_CARE_PROVIDER_SITE_OTHER): Payer: 59 | Admitting: Physician Assistant

## 2019-12-12 VITALS — BP 122/80 | HR 85 | Temp 98.3°F | Ht 62.0 in | Wt 146.5 lb

## 2019-12-12 DIAGNOSIS — R1011 Right upper quadrant pain: Secondary | ICD-10-CM

## 2019-12-12 NOTE — Progress Notes (Signed)
Jenny Lane is a 34 y.o. female here for a new problem.  I acted as a Education administrator for [employer], PA-C Anselmo Pickler, LPN  History of Present Illness:   Chief Complaint  Patient presents with  . Abdominal Pain    RUQ    HPI   Abdominal pain Pt c/o RUQ abd pain x 3 or 4 months, no radiation. Denies nausea, vomiting, no diarrhea or constipation, rectal bleeding, dizziness, lightheadedness. Pain is intermittent and can be reproduced if touching a certain area. Pt c/o abdominal bloating. Doesn't use birth control -- does natural family planning. She eats spicy foods but has difficulty limiting them because they are often used in her family's recipes. She states that her pain is intermittent. Doesn't drink alcohol. She is not sure if it is associated with eating.  Was taking OTC nexium but stopped it because symptoms resolved. Now taking protonix and not finding it as effective.  Patient's last menstrual period was 12/04/2019.    Past Medical History:  Diagnosis Date  . Depression   . History of chicken pox   . Tuberculosis    Received 9 Months of Treatement in 2006  . Typhoid fever   . Varicella    As a child      Social History   Socioeconomic History  . Marital status: Married    Spouse name: Not on file  . Number of children: Not on file  . Years of education: Not on file  . Highest education level: Not on file  Occupational History  . Not on file  Tobacco Use  . Smoking status: Never Smoker  . Smokeless tobacco: Never Used  Substance and Sexual Activity  . Alcohol use: No  . Drug use: No  . Sexual activity: Yes  Other Topics Concern  . Not on file  Social History Narrative  . Not on file   Social Determinants of Health   Financial Resource Strain:   . Difficulty of Paying Living Expenses:   Food Insecurity:   . Worried About Charity fundraiser in the Last Year:   . Arboriculturist in the Last Year:   Transportation Needs:   . Film/video editor  (Medical):   Marland Kitchen Lack of Transportation (Non-Medical):   Physical Activity:   . Days of Exercise per Week:   . Minutes of Exercise per Session:   Stress:   . Feeling of Stress :   Social Connections:   . Frequency of Communication with Friends and Family:   . Frequency of Social Gatherings with Friends and Family:   . Attends Religious Services:   . Active Member of Clubs or Organizations:   . Attends Archivist Meetings:   Marland Kitchen Marital Status:   Intimate Partner Violence:   . Fear of Current or Ex-Partner:   . Emotionally Abused:   Marland Kitchen Physically Abused:   . Sexually Abused:     History reviewed. No pertinent surgical history.  Family History  Problem Relation Age of Onset  . Diabetes Father     Allergies  Allergen Reactions  . Biaxin [Clarithromycin] Anaphylaxis  . Cefdinir Hives    Sneezing, swollen lips, and hives.  Last taken 2 months ago for a sinus infection.   . Clindamycin/Lincomycin Hives    Patient experienced low back pain, hives, and a dark line around her bottom lip when taking medication.   . Sulfa Antibiotics Hives and Swelling    Lips Swell.  Last taken during  childhood.   . Tetracyclines & Related     Childhood reaction  . Ampicillin Other (See Comments)    Childhood reaction    Current Medications:   Current Outpatient Medications:  .  Calcium Carbonate 500 MG CHEW, Chew 2 each by mouth daily., Disp: , Rfl:  .  Cholecalciferol 25 MCG (1000 UT) tablet, Take by mouth., Disp: , Rfl:  .  Multiple Vitamins-Minerals (OPTIVITE P.M.T.) TABS, , Disp: , Rfl:  .  pantoprazole (PROTONIX) 40 MG tablet, Take 40 mg by mouth daily., Disp: , Rfl:    Review of Systems:   ROS  Negative unless otherwise specified per HPI.  Vitals:   Vitals:   12/12/19 1537  BP: 122/80  Pulse: 85  Temp: 98.3 F (36.8 C)  TempSrc: Temporal  SpO2: 99%  Weight: 146 lb 8 oz (66.5 kg)  Height: 5\' 2"  (1.575 m)     Body mass index is 26.8 kg/m.  Physical Exam:    Physical Exam Vitals and nursing note reviewed.  Constitutional:      General: She is not in acute distress.    Appearance: She is well-developed. She is not ill-appearing or toxic-appearing.  Cardiovascular:     Rate and Rhythm: Normal rate and regular rhythm.     Pulses: Normal pulses.     Heart sounds: Normal heart sounds, S1 normal and S2 normal.     Comments: No LE edema Pulmonary:     Effort: Pulmonary effort is normal.     Breath sounds: Normal breath sounds.  Abdominal:     General: Abdomen is flat. Bowel sounds are normal.     Palpations: Abdomen is soft.     Tenderness: There is no right CVA tenderness, left CVA tenderness, guarding or rebound.     Comments: Slight tenderness to palpation of lower R lateral rib -- no skin changes  Skin:    General: Skin is warm and dry.  Neurological:     Mental Status: She is alert.     GCS: GCS eye subscore is 4. GCS verbal subscore is 5. GCS motor subscore is 6.  Psychiatric:        Speech: Speech normal.        Behavior: Behavior normal. Behavior is cooperative.       Assessment and Plan:   Bryla was seen today for abdominal pain.  Diagnoses and all orders for this visit:  Right upper quadrant abdominal pain   No red flags on exam. No evidence of acute abdomen. Denied any concerns/test for pregnancy. I offered a KUB to evaluate her rib area and look for any gas/stool burden however she declined. Recommended following low-spice diet, push fluids, work on being active. Switch back to OTC nexium as she found that to be more effective for her. Worsening precautions advised. She has CPE scheduled with PCP in a few weeks and would like to defer further work-up to that time.  . Reviewed expectations re: course of current medical issues. . Discussed self-management of symptoms. . Outlined signs and symptoms indicating need for more acute intervention. . Patient verbalized understanding and all questions were answered. . See  orders for this visit as documented in the electronic medical record. . Patient received an After-Visit Summary.  CMA or LPN served as scribe during this visit. History, Physical, and Plan performed by medical provider. The above documentation has been reviewed and is accurate and complete.  Kenney Houseman, PA-C

## 2019-12-12 NOTE — Patient Instructions (Signed)
It was great to see you!  Please follow-up with Dr. Artis Flock about your symptoms.   Gastroesophageal Reflux Disease, Adult Gastroesophageal reflux (GER) happens when acid from the stomach flows up into the tube that connects the mouth and the stomach (esophagus). Normally, food travels down the esophagus and stays in the stomach to be digested. With GER, food and stomach acid sometimes move back up into the esophagus. You may have a disease called gastroesophageal reflux disease (GERD) if the reflux:  Happens often.  Causes frequent or very bad symptoms.  Causes problems such as damage to the esophagus. When this happens, the esophagus becomes sore and swollen (inflamed). Over time, GERD can make small holes (ulcers) in the lining of the esophagus. What are the causes? This condition is caused by a problem with the muscle between the esophagus and the stomach. When this muscle is weak or not normal, it does not close properly to keep food and acid from coming back up from the stomach. The muscle can be weak because of:  Tobacco use.  Pregnancy.  Having a certain type of hernia (hiatal hernia).  Alcohol use.  Certain foods and drinks, such as coffee, chocolate, onions, and peppermint. What increases the risk? You are more likely to develop this condition if you:  Are overweight.  Have a disease that affects your connective tissue.  Use NSAID medicines. What are the signs or symptoms? Symptoms of this condition include:  Heartburn.  Difficult or painful swallowing.  The feeling of having a lump in the throat.  A bitter taste in the mouth.  Bad breath.  Having a lot of saliva.  Having an upset or bloated stomach.  Belching.  Chest pain. Different conditions can cause chest pain. Make sure you see your doctor if you have chest pain.  Shortness of breath or noisy breathing (wheezing).  Ongoing (chronic) cough or a cough at night.  Wearing away of the surface of  teeth (tooth enamel).  Weight loss. How is this treated? Treatment will depend on how bad your symptoms are. Your doctor may suggest:  Changes to your diet.  Medicine.  Surgery. Follow these instructions at home: Eating and drinking   Follow a diet as told by your doctor. You may need to avoid foods and drinks such as: ? Coffee and tea (with or without caffeine). ? Drinks that contain alcohol. ? Energy drinks and sports drinks. ? Bubbly (carbonated) drinks or sodas. ? Chocolate and cocoa. ? Peppermint and mint flavorings. ? Garlic and onions. ? Horseradish. ? Spicy and acidic foods. These include peppers, chili powder, curry powder, vinegar, hot sauces, and BBQ sauce. ? Citrus fruit juices and citrus fruits, such as oranges, lemons, and limes. ? Tomato-based foods. These include red sauce, chili, salsa, and pizza with red sauce. ? Fried and fatty foods. These include donuts, french fries, potato chips, and high-fat dressings. ? High-fat meats. These include hot dogs, rib eye steak, sausage, ham, and bacon. ? High-fat dairy items, such as whole milk, butter, and cream cheese.  Eat small meals often. Avoid eating large meals.  Avoid drinking large amounts of liquid with your meals.  Avoid eating meals during the 2-3 hours before bedtime.  Avoid lying down right after you eat.  Do not exercise right after you eat. Lifestyle   Do not use any products that contain nicotine or tobacco. These include cigarettes, e-cigarettes, and chewing tobacco. If you need help quitting, ask your doctor.  Try to lower your stress.  If you need help doing this, ask your doctor.  If you are overweight, lose an amount of weight that is healthy for you. Ask your doctor about a safe weight loss goal. General instructions  Pay attention to any changes in your symptoms.  Take over-the-counter and prescription medicines only as told by your doctor. Do not take aspirin, ibuprofen, or other NSAIDs  unless your doctor says it is okay.  Wear loose clothes. Do not wear anything tight around your waist.  Raise (elevate) the head of your bed about 6 inches (15 cm).  Avoid bending over if this makes your symptoms worse.  Keep all follow-up visits as told by your doctor. This is important. Contact a doctor if:  You have new symptoms.  You lose weight and you do not know why.  You have trouble swallowing or it hurts to swallow.  You have wheezing or a cough that keeps happening.  Your symptoms do not get better with treatment.  You have a hoarse voice. Get help right away if:  You have pain in your arms, neck, jaw, teeth, or back.  You feel sweaty, dizzy, or light-headed.  You have chest pain or shortness of breath.  You throw up (vomit) and your throw-up looks like blood or coffee grounds.  You pass out (faint).  Your poop (stool) is bloody or black.  You cannot swallow, drink, or eat. Summary  If a person has gastroesophageal reflux disease (GERD), food and stomach acid move back up into the esophagus and cause symptoms or problems such as damage to the esophagus.  Treatment will depend on how bad your symptoms are.  Follow a diet as told by your doctor.  Take all medicines only as told by your doctor. This information is not intended to replace advice given to you by your health care provider. Make sure you discuss any questions you have with your health care provider. Document Revised: 03/02/2018 Document Reviewed: 03/02/2018 Elsevier Patient Education  [place].

## 2019-12-12 NOTE — Telephone Encounter (Signed)
Nurse Assessment Nurse: Delford Field, RN, Jenny Lane Date/Time Jenny Lane Time): 12/11/2019 4:19:12 PM Confirm and document reason for call. If symptomatic, describe symptoms. ---Caller states that she is having right flank pain for a couple weeks that comes and goes. Has the patient had close contact with a person known or suspected to have the novel coronavirus illness OR traveled / lives in area with major community spread (including international travel) in the last 14 days from the onset of symptoms? * If Asymptomatic, screen for exposure and travel within the last 14 days. ---No Does the patient have any new or worsening symptoms? ---Yes Will a triage be completed? ---Yes Related visit to physician within the last 2 weeks? ---Yes Does the PT have any chronic conditions? (i.e. diabetes, asthma, this includes High risk factors for pregnancy, etc.) ---No Is the patient pregnant or possibly pregnant? (Ask all females between the ages of 21-55) ---No Is this a behavioral health or substance abuse call? ---No Guidelines Guideline Title Affirmed Question Affirmed Notes Nurse Date/Time (Eastern Time) Flank Pain MODERATE pain (e.g., interferes with normal activities or awakens from sleep) Delford Field, RN, Jenny Lane 12/11/2019 4:20:12 PM Disp. Time Jenny Lane Time) Disposition Final User 12/11/2019 4:23:07 PM See PCP within 24 Hours Yes Delford Field, RN, Jenny Lane  Has an appointment scheduled with [facility] tomorrow.

## 2019-12-12 NOTE — Telephone Encounter (Signed)
FYI

## 2019-12-22 ENCOUNTER — Ambulatory Visit (INDEPENDENT_AMBULATORY_CARE_PROVIDER_SITE_OTHER): Payer: 59 | Admitting: Family Medicine

## 2019-12-22 ENCOUNTER — Other Ambulatory Visit: Payer: Self-pay

## 2019-12-22 ENCOUNTER — Encounter: Payer: Self-pay | Admitting: Family Medicine

## 2019-12-22 VITALS — BP 118/76 | HR 80 | Temp 98.3°F | Ht 62.0 in | Wt 147.8 lb

## 2019-12-22 DIAGNOSIS — R1011 Right upper quadrant pain: Secondary | ICD-10-CM

## 2019-12-22 DIAGNOSIS — R1013 Epigastric pain: Secondary | ICD-10-CM

## 2019-12-22 DIAGNOSIS — M67432 Ganglion, left wrist: Secondary | ICD-10-CM | POA: Diagnosis not present

## 2019-12-22 DIAGNOSIS — A159 Respiratory tuberculosis unspecified: Secondary | ICD-10-CM | POA: Insufficient documentation

## 2019-12-22 DIAGNOSIS — Z0000 Encounter for general adult medical examination without abnormal findings: Secondary | ICD-10-CM | POA: Diagnosis not present

## 2019-12-22 DIAGNOSIS — L503 Dermatographic urticaria: Secondary | ICD-10-CM | POA: Insufficient documentation

## 2019-12-22 LAB — COMPREHENSIVE METABOLIC PANEL
ALT: 18 U/L (ref 0–35)
AST: 18 U/L (ref 0–37)
Albumin: 4.6 g/dL (ref 3.5–5.2)
Alkaline Phosphatase: 55 U/L (ref 39–117)
BUN: 10 mg/dL (ref 6–23)
CO2: 28 mEq/L (ref 19–32)
Calcium: 10 mg/dL (ref 8.4–10.5)
Chloride: 101 mEq/L (ref 96–112)
Creatinine, Ser: 0.74 mg/dL (ref 0.40–1.20)
GFR: 89.38 mL/min (ref >60.00)
Glucose, Bld: 89 mg/dL (ref 70–99)
Potassium: 4.4 mEq/L (ref 3.5–5.1)
Sodium: 137 mEq/L (ref 135–145)
Total Bilirubin: 0.4 mg/dL (ref 0.2–1.2)
Total Protein: 7.7 g/dL (ref 6.0–8.3)

## 2019-12-22 LAB — LIPID PANEL
Cholesterol: 161 mg/dL (ref 0–200)
HDL: 52.2 mg/dL (ref >39.00)
LDL Cholesterol: 96 mg/dL (ref 0–99)
NonHDL: 109.01
Total CHOL/HDL Ratio: 3
Triglycerides: 67 mg/dL (ref 0.0–149.0)
VLDL: 13.4 mg/dL (ref 0.0–40.0)

## 2019-12-22 LAB — VITAMIN D 25 HYDROXY (VIT D DEFICIENCY, FRACTURES): VITD: 30.32 ng/mL (ref 30.00–100.00)

## 2019-12-22 LAB — TSH: TSH: 1.52 u[IU]/mL (ref 0.35–4.50)

## 2019-12-22 LAB — H. PYLORI ANTIBODY, IGG: H Pylori IgG: NEGATIVE

## 2019-12-22 LAB — LIPASE: Lipase: 4 U/L — ABNORMAL LOW (ref 11.0–59.0)

## 2019-12-22 NOTE — Progress Notes (Signed)
Patient: Jenny Lane MRN: [id number] DOB: [birth year] PCP: Orma Flaming, MD     Subjective:  Chief Complaint  Patient presents with  . Annual Exam  . Abdominal Pain  . RUQ pain  . cyst on wrist    HPI: The patient is a 34 y.o. female who presents today for annual exam. She denies any changes to past medical history. There have been no recent hospitalizations. They are not following a well balanced diet and exercise plan. Weight has been increasing steadily.   No family history of colon cancer or breast cancer in first degree relative.   Epigastric pain: she feels tugging intermittently  At her epigastric area. She notices when she is leaning down on her couch in a sitting position. She states when she was on the omeprazole she had no symptoms. At 22 days she stopped this medication and had the pain again. Pain not worse with food. She has had no vomiting, but nausea. No blood in her stool. No diarrhea. She went to [place] in 2018. She states they boil water there. She does not use NSAIDs, drinks tea BID, chocolate once/week. She is stressed bc she can't get a driver license. No coffee/alcohol. She does eat spicy/greasy foods, but stopped this a month ago. Not associated with food. She does have 2 kids. Vaginal deliveries.   She also has pain in her RUQ that she has seen the PA and an urgent care. Comes and goes, not associated with food. No nausea or vomiting.   "bump" on her left wrist she would like me to look at. Not growing, no pain.   Immunization History  Administered Date(s) Administered  . Influenza,inj,Quad PF,6+ Mos 11/02/2019  . Influenza,inj,quad, With Preservative 06/07/2017  . PFIZER SARS-COV-2 Vaccination 11/27/2019   Colonoscopy: routine screening  Mammogram: routine screening  Pap smear: 2020. Wnl.    Review of Systems  Constitutional: Negative for chills, fatigue and fever.  HENT: Negative for dental problem, ear pain, hearing loss, sore throat and trouble  swallowing.   Eyes: Negative for visual disturbance.  Respiratory: Negative for cough, chest tightness and shortness of breath.   Cardiovascular: Negative for chest pain, palpitations and leg swelling.  Gastrointestinal: Positive for abdominal pain and nausea. Negative for anal bleeding, blood in stool, constipation, diarrhea and vomiting.       Patient states that in the morning she feels like she could vomit. Pain in both RUQ and epigastric area   Endocrine: Negative for cold intolerance, polydipsia, polyphagia and polyuria.  Genitourinary: Negative for dysuria and hematuria.  Musculoskeletal: Negative for arthralgias.  Skin: Negative for rash.  Neurological: Negative for dizziness and headaches.  Psychiatric/Behavioral: Negative for dysphoric mood and sleep disturbance. The patient is not nervous/anxious.     Allergies Patient is allergic to biaxin [clarithromycin]; cefdinir; clindamycin/lincomycin; sulfa antibiotics; and tetracyclines & related.  Past Medical History Patient  has a past medical history of Depression, History of chicken pox, Tuberculosis, Typhoid fever, and Varicella.  Surgical History Patient  has no past surgical history on file.  Family History Pateint's family history includes Diabetes in her father.  Social History Patient  reports that she has never smoked. She has never used smokeless tobacco. She reports that she does not drink alcohol or use drugs.    Objective: Vitals:   12/22/19 1412  BP: 118/76  Pulse: 80  Temp: 98.3 F (36.8 C)  TempSrc: Temporal  SpO2: 98%  Weight: 147 lb 12.8 oz (67 kg)  Height: 5\' 2"  (1.575  m)    Body mass index is 27.03 kg/m.  Physical Exam Vitals reviewed.  Constitutional:      Appearance: Normal appearance. She is well-developed. She is obese.  HENT:     Head: Normocephalic and atraumatic.     Right Ear: Tympanic membrane, ear canal and external ear normal.     Left Ear: Tympanic membrane, ear canal and  external ear normal.     Nose: Nose normal.     Mouth/Throat:     Mouth: Mucous membranes are moist.  Eyes:     Extraocular Movements: Extraocular movements intact.     Conjunctiva/sclera: Conjunctivae normal.     Pupils: Pupils are equal, round, and reactive to light.  Neck:     Thyroid: No thyromegaly.  Cardiovascular:     Rate and Rhythm: Normal rate and regular rhythm.     Heart sounds: Normal heart sounds. No murmur.  Pulmonary:     Effort: Pulmonary effort is normal.     Breath sounds: Normal breath sounds.  Abdominal:     General: Bowel sounds are normal. There is no distension.     Palpations: Abdomen is soft.     Tenderness: There is abdominal tenderness. There is no guarding or rebound.     Hernia: No hernia is present.     Comments: Unequivocal murphy's sign, but she is tender with palpation upon inhalation in RUQ.  TTP in epigastric area   Musculoskeletal:     Cervical back: Normal range of motion and neck supple.     Comments: Ganglion cyst of left dorsum wrist   Lymphadenopathy:     Cervical: No cervical adenopathy.  Skin:    General: Skin is warm and dry.     Capillary Refill: Capillary refill takes less than 2 seconds.     Findings: No rash.  Neurological:     General: No focal deficit present.     Mental Status: She is alert and oriented to person, place, and time.     Cranial Nerves: No cranial nerve deficit.     Coordination: Coordination normal.     Deep Tendon Reflexes: Reflexes normal.  Psychiatric:        Mood and Affect: Mood normal.        Behavior: Behavior normal.          Office Visit from 12/22/2019 in [facility]  PHQ-2 Total Score  0      Assessment/plan: 1. Annual physical exam Routine labs today. Had cbc done recently in hospital and requests to not repeat. Reasonable. HM reviewed and UTD. Need pap smear from her gyn. Will request records. Really encouraged weight loss and exercise. Has had covid vaccine x 1.   Patient counseling [x]    Nutrition: Stressed importance of moderation in sodium/caffeine intake, saturated fat and cholesterol, caloric balance, sufficient intake of fresh fruits, vegetables, fiber, calcium, iron, and 1 mg of folate supplement per day (for females capable of pregnancy).  [x]    Stressed the importance of regular exercise.   []    Substance Abuse: Discussed cessation/primary prevention of tobacco, alcohol, or other drug use; driving or other dangerous activities under the influence; availability of treatment for abuse.   [x]    Injury prevention: Discussed safety belts, safety helmets, smoke detector, smoking near bedding or upholstery.   [x]    Sexuality: Discussed sexually transmitted diseases, partner selection, use of condoms, avoidance of unintended pregnancy  and contraceptive alternatives.  [x]    Dental health: Discussed importance of  regular tooth brushing, flossing, and dental visits.  [x]    Health maintenance and immunizations reviewed. Please refer to Health maintenance section.    - Comprehensive metabolic panel - Lipid panel - TSH - VITAMIN D 25 Hydroxy (Vit-D Deficiency, Fractures)  2. Epigastric pain Need to rule out h.pylori with travel to . Will check igG today, but discussed this is not helpful for acute infection. She will stop the PPI x 2 weeks then return for breath test. Differential includes gastritis, ulcer, hiatal hernia. Not associated with food. Did discuss if she had ulcer the PPI is treatment, so if any worsening symptoms when she stops medication she is to let me know. precautions given. Also advised to decrease caffeine, chocolate and try to de stress.  - H. pylori antibody, IgG - H. pylori breath test; Future  3. RUQ pain R/o gallstones. Labs and ultrasound. Not an acute abdomen and pain mild. Discussed diet and ER precautions given.  - [place] Abdomen Complete; Future  4. Ganglion cyst dorsum of left wrist  Discussed pathophysiology of ganglion  cyst and treatment. She is fine to watch as it does not bother her. Will let me know if desires referral to ortho.    This visit occurred during the SARS-CoV-2 public health emergency.  Safety protocols were in place, including screening questions prior to the visit, additional usage of staff PPE, and extensive cleaning of exam room while observing appropriate contact time as indicated for disinfecting solutions.     Return if symptoms worsen or fail to improve.     [place], MD [facility]  12/22/2019

## 2019-12-22 NOTE — Progress Notes (Deleted)
Patient: Jenny Lane MRN: [id number] DOB: September 08, 1985 PCP: Orland Mustard, MD     Subjective:  No chief complaint on file.   HPI: The patient is a 34 y.o. female who presents today for annual exam. {He/she (caps):30048} denies any changes to past medical history. There have been no recent hospitalizations. They {Actions; are/are not:16769} following a well balanced diet and exercise plan. Weight has been {trend:16658}. No complaints today.   Immunization History  Administered Date(s) Administered  . Influenza,inj,Quad PF,6+ Mos 11/02/2019  . Influenza,inj,quad, With Preservative 06/07/2017   Colonoscopy: Mammogram:  Pap smear:  PSA:   Review of Systems  Allergies Patient is allergic to biaxin [clarithromycin]; cefdinir; clindamycin/lincomycin; sulfa antibiotics; tetracyclines & related; and ampicillin.  Past Medical History Patient  has a past medical history of Depression, History of chicken pox, Tuberculosis, Typhoid fever, and Varicella.  Surgical History Patient  has no past surgical history on file.  Family History Pateint's family history includes Diabetes in her father.  Social History Patient  reports that she has never smoked. She has never used smokeless tobacco. She reports that she does not drink alcohol or use drugs.    Objective: There were no vitals filed for this visit.  There is no height or weight on file to calculate BMI.  Physical Exam     Assessment/plan:   No problem-specific Assessment & Plan notes found for this encounter.    No follow-ups on file.     Manuela Schwartz, MD [facility]  12/22/2019

## 2019-12-22 NOTE — Patient Instructions (Addendum)
1) for your wrist: that is called a ganglion cyst. They are not harmful, but if starts to bother you we will send you to the orthopedic doctor.   2) for your right sided pain. Checking labs and getting ultrasound to rule out your gallbladder. This is nothing acute, but if you have fever or worsening pain, go to ER.   3) for the epigastric pain (center of belly).. I want to rule out a bacterial infection called h.pylori. you need to stop the protonix for 2 weeks then come back in for this. Checking labs as well. Try to decrease caffeine and stress. Other thoughts are gastritis or a hiatal hernia. Will let you know plan as I get more things back.    Gastritis, Adult  Gastritis is swelling (inflammation) of the stomach. Gastritis can develop quickly (acute). It can also develop slowly over time (chronic). It is important to get help for this condition. If you do not get help, your stomach can bleed, and you can get sores (ulcers) in your stomach. What are the causes? This condition may be caused by:  Germs that get to your stomach.  Drinking too much alcohol.  Medicines you are taking.  Too much acid in the stomach.  A disease of the intestines or stomach.  Stress.  An allergic reaction.  Crohn's disease.  Some cancer treatments (radiation). Sometimes the cause of this condition is not known. What are the signs or symptoms? Symptoms of this condition include:  Pain in your stomach.  A burning feeling in your stomach.  Feeling sick to your stomach (nauseous).  Throwing up (vomiting).  Feeling too full after you eat.  Weight loss.  Bad breath.  Throwing up blood.  Blood in your poop (stool). How is this diagnosed? This condition may be diagnosed with:  Your medical history and symptoms.  A physical exam.  Tests. These can include: ? Blood tests. ? Stool tests. ? A procedure to look inside your stomach (upper endoscopy). ? A test in which a sample of tissue is  taken for testing (biopsy). How is this treated? Treatment for this condition depends on what caused it. You may be given:  Antibiotic medicine, if your condition was caused by germs.  H2 blockers and similar medicines, if your condition was caused by too much acid. Follow these instructions at home: Medicines  Take over-the-counter and prescription medicines only as told by your doctor.  If you were prescribed an antibiotic medicine, take it as told by your doctor. Do not stop taking it even if you start to feel better. Eating and drinking   Eat small meals often, instead of large meals.  Avoid foods and drinks that make your symptoms worse.  Drink enough fluid to keep your pee (urine) pale yellow. Alcohol use  Do not drink alcohol if: ? Your doctor tells you not to drink. ? You are pregnant, may be pregnant, or are planning to become pregnant.  If you drink alcohol: ? Limit your use to:  0-1 drink a day for women.  0-2 drinks a day for men. ? Be aware of how much alcohol is in your drink. In the [place] one drink equals one 12 oz bottle of beer (355 mL), one 5 oz glass of wine (148 mL), or one 1 oz glass of hard liquor (44 mL). General instructions  Talk with your doctor about ways to manage stress. You can exercise or do deep breathing, meditation, or yoga.  Do not smoke  or use products that have nicotine or tobacco. If you need help quitting, ask your doctor.  Keep all follow-up visits as told by your doctor. This is important. Contact a doctor if:  Your symptoms get worse.  Your symptoms go away and then come back. Get help right away if:  You throw up blood or something that looks like coffee grounds.  You have black or dark red poop.  You throw up any time you try to drink fluids.  Your stomach pain gets worse.  You have a fever.  You do not feel better after one week. Summary  Gastritis is swelling (inflammation) of the stomach.  You must get help  for this condition. If you do not get help, your stomach can bleed, and you can get sores (ulcers).  This condition is diagnosed with medical history, physical exam, or tests.  You can be treated with medicines for germs or medicines to block too much acid in your stomach. This information is not intended to replace advice given to you by your health care provider. Make sure you discuss any questions you have with your health care provider. Document Revised: 01/11/2018 Document Reviewed: 01/11/2018 Elsevier Patient Education  [place] 72-5 Years Old, Female Preventive care refers to visits with your health care provider and lifestyle choices that can promote health and wellness. This includes:  A yearly physical exam. This may also be called an annual well check.  Regular dental visits and eye exams.  Immunizations.  Screening for certain conditions.  Healthy lifestyle choices, such as eating a healthy diet, getting regular exercise, not using drugs or products that contain nicotine and tobacco, and limiting alcohol use. What can I expect for my preventive care visit? Physical exam Your health care provider will check your:  Height and weight. This may be used to calculate body mass index (BMI), which tells if you are at a healthy weight.  Heart rate and blood pressure.  Skin for abnormal spots. Counseling Your health care provider may ask you questions about your:  Alcohol, tobacco, and drug use.  Emotional well-being.  Home and relationship well-being.  Sexual activity.  Eating habits.  Work and work Statistician.  Method of birth control.  Menstrual cycle.  Pregnancy history. What immunizations do I need?  Influenza (flu) vaccine  This is recommended every year. Tetanus, diphtheria, and pertussis (Tdap) vaccine  You may need a Td booster every 10 years. Varicella (chickenpox) vaccine  You may need this if you have not been  vaccinated. Human papillomavirus (HPV) vaccine  If recommended by your health care provider, you may need three doses over 6 months. Measles, mumps, and rubella (MMR) vaccine  You may need at least one dose of MMR. You may also need a second dose. Meningococcal conjugate (MenACWY) vaccine  One dose is recommended if you are age 62-21 years and a first-year college student living in a residence hall, or if you have one of several medical conditions. You may also need additional booster doses. Pneumococcal conjugate (PCV13) vaccine  You may need this if you have certain conditions and were not previously vaccinated. Pneumococcal polysaccharide (PPSV23) vaccine  You may need one or two doses if you smoke cigarettes or if you have certain conditions. Hepatitis A vaccine  You may need this if you have certain conditions or if you travel or work in places where you may be exposed to hepatitis A. Hepatitis B vaccine  You may  need this if you have certain conditions or if you travel or work in places where you may be exposed to hepatitis B. Haemophilus influenzae type b (Hib) vaccine  You may need this if you have certain conditions. You may receive vaccines as individual doses or as more than one vaccine together in one shot (combination vaccines). Talk with your health care provider about the risks and benefits of combination vaccines. What tests do I need?  Blood tests  Lipid and cholesterol levels. These may be checked every 5 years starting at age 2.  Hepatitis C test.  Hepatitis B test. Screening  Diabetes screening. This is done by checking your blood sugar (glucose) after you have not eaten for a while (fasting).  Sexually transmitted disease (STD) testing.  BRCA-related cancer screening. This may be done if you have a family history of breast, ovarian, tubal, or peritoneal cancers.  Pelvic exam and Pap test. This may be done every 3 years starting at age 26. Starting at  age 42, this may be done every 5 years if you have a Pap test in combination with an HPV test. Talk with your health care provider about your test results, treatment options, and if necessary, the need for more tests. Follow these instructions at home: Eating and drinking   Eat a diet that includes fresh fruits and vegetables, whole grains, lean protein, and low-fat dairy.  Take vitamin and mineral supplements as recommended by your health care provider.  Do not drink alcohol if: ? Your health care provider tells you not to drink. ? You are pregnant, may be pregnant, or are planning to become pregnant.  If you drink alcohol: ? Limit how much you have to 0-1 drink a day. ? Be aware of how much alcohol is in your drink. In the [place] one drink equals one 12 oz bottle of beer (355 mL), one 5 oz glass of wine (148 mL), or one 1 oz glass of hard liquor (44 mL). Lifestyle  Take daily care of your teeth and gums.  Stay active. Exercise for at least 30 minutes on 5 or more days each week.  Do not use any products that contain nicotine or tobacco, such as cigarettes, e-cigarettes, and chewing tobacco. If you need help quitting, ask your health care provider.  If you are sexually active, practice safe sex. Use a condom or other form of birth control (contraception) in order to prevent pregnancy and STIs (sexually transmitted infections). If you plan to become pregnant, see your health care provider for a preconception visit. What's next?  Visit your health care provider once a year for a well check visit.  Ask your health care provider how often you should have your eyes and teeth checked.  Stay up to date on all vaccines. This information is not intended to replace advice given to you by your health care provider. Make sure you discuss any questions you have with your health care provider. Document Revised: 05/05/2018 Document Reviewed: 05/05/2018 Elsevier Patient Education  2020 [employer].

## 2019-12-25 ENCOUNTER — Encounter: Payer: Self-pay | Admitting: Family Medicine

## 2019-12-25 NOTE — Telephone Encounter (Signed)
FYI

## 2020-01-01 ENCOUNTER — Encounter: Payer: Self-pay | Admitting: Family Medicine

## 2020-01-02 ENCOUNTER — Encounter: Payer: Self-pay | Admitting: Family Medicine

## 2020-01-02 ENCOUNTER — Telehealth: Payer: Self-pay | Admitting: Family Medicine

## 2020-01-02 NOTE — Telephone Encounter (Signed)
Patient Name: Jenny Lane Gender: Female DOB: [birth year] Age: 34 Y 10 M 21 D Return Phone Number: [phone] (Primary) Address: City/State/Zip: [place] [address] Client [facility] [employer] [facility] Physician Orland Mustard- MD Contact Type Call Who Is Calling Patient / Member / Family / Caregiver Call Type Triage / Clinical Relationship To Patient Self Return Phone Number [phone] (Primary) Chief Complaint Breast Symptoms Reason for Call Symptomatic / Request for Health Information Initial Comment Caller states she is experiencing right breast pain. She is needing to know how long she can take Tylenol. Translation No Nurse Assessment Nurse: Odis Luster, RN, Bjorn Loser Date/Time Lamount Cohen Time): 01/02/2020 12:10:11 PM Confirm and document reason for call. If symptomatic, describe symptoms. ---Caller states she is experiencing right sided abd pain. She is needing to know how long she can take Tylenol. Reports that it began on Sunday, stopped for abit. Monday she had an acute back ache. She just took some tylenol and it has eased off some. Denies n/v/fever. Has the patient had close contact with a person known or suspected to have the novel coronavirus illness OR traveled / lives in area with major community spread (including international travel) in the last 14 days from the onset of symptoms? * If Asymptomatic, screen for exposure and travel within the last 14 days. ---No Does the patient have any new or worsening symptoms? ---Yes Will a triage be completed? ---Yes Related visit to physician within the last 2 weeks? ---Yes Does the PT have any chronic conditions? (i.e. diabetes, asthma, this includes High risk factors for pregnancy, etc.) ---Yes List chronic conditions. ---GERD Is the patient pregnant or possibly pregnant? (Ask all females between the ages of 16-55) ---No Is this a behavioral health  or substance abuse call? ---No Guidelines Guideline Title Affirmed Question Affirmed Notes Nurse Date/Time Lamount Cohen Time) Abdominal Pain - Upper [1] MILD-MODERATE pain AND [2] not relieved by antacids Odis Luster, RN, Rhonda 01/02/2020 12:12:56 PM Disp. Time Lamount Cohen Time) Disposition Final User 01/02/2020 12:16:59 PM See HCP within 4 Hours (or PCP triage) Yes Odis Luster, RN, Juliene Pina Disagree/Comply Disagree Caller Understands Yes PreDisposition Call Doctor Care Advice Given Per Guideline SEE HCP WITHIN 4 HOURS (OR PCP TRIAGE): * IF OFFICE WILL BE OPEN: You need to be seen within the next 3 or 4 hours. Call your doctor (or NP/PA) now or as soon as the office opens. CALL BACK IF: * You become worse. CARE ADVICE given per Abdominal Pain, Upper (Adult) guideline. Comments User: Marlyce Huge, RN Date/Time Lamount Cohen Time): 01/02/2020 12:16:31 PM Caller reports that she is scheduled to have an ultrasound on Friday and wishes to see her provider after this appt. User: Marlyce Huge, RN Date/Time Lamount Cohen Time): 01/02/2020 12:16:55 PM Advised on Tylenol dosage: max 500mg  tabs 1-2 q 8-12 hours. Referrals GO TO FACILITY REFUSED

## 2020-01-02 NOTE — Telephone Encounter (Signed)
Patient states she is having stomach and back pain.  States she spoke with a triage nurse and was informed to be seen today.  Patient states she wants to wait to be seen on Friday b/c that is the date of her ultrasound.   Would like to know if she can be worked in for this day?   We have not received notes from [facility].

## 2020-01-02 NOTE — Telephone Encounter (Signed)
Noted  

## 2020-01-05 ENCOUNTER — Ambulatory Visit
Admission: RE | Admit: 2020-01-05 | Discharge: 2020-01-05 | Disposition: A | Discharge status: Home / Self Care | Payer: 59 | Source: Ambulatory Visit | Attending: Family Medicine | Admitting: Family Medicine

## 2020-01-05 DIAGNOSIS — R1011 Right upper quadrant pain: Secondary | ICD-10-CM

## 2020-01-08 ENCOUNTER — Other Ambulatory Visit: Payer: Self-pay | Admitting: Family Medicine

## 2020-01-08 ENCOUNTER — Telehealth: Payer: Self-pay | Admitting: Family Medicine

## 2020-01-08 ENCOUNTER — Encounter: Payer: Self-pay | Admitting: Family Medicine

## 2020-01-08 DIAGNOSIS — R1013 Epigastric pain: Secondary | ICD-10-CM

## 2020-01-08 NOTE — Telephone Encounter (Signed)
Chief Complaint CHEST PAIN (>=21 years) - pain, pressure, heaviness or tightness Reason for Call Symptomatic / Request for Health Information Initial Comment Caller states that she has left side chest pain. Translation No Nurse Assessment Nurse: Freida Busman, RN, Diane Date/Time Lamount Cohen Time): 01/08/2020 12:00:25 PM Confirm and document reason for call. If symptomatic, describe symptoms. ---Caller states that she has left side chest pain. Caller states she took an antacid and it is now gone. No shortness or breath or radiating. It is the second time it has happened. Last was two months ago. Pain was on the left near her heart. No other symptoms while the pain was going on. She also had some heaviness, but not now. Has the patient had close contact with a person known or suspected to have the novel coronavirus illness OR traveled / lives in area with major community spread (including international travel) in the last 14 days from the onset of symptoms? * If Asymptomatic, screen for exposure and travel within the last 14 days. ---No Does the patient have any new or worsening symptoms? ---Yes Will a triage be completed? ---Yes Related visit to physician within the last 2 weeks? ---No Does the PT have any chronic conditions? (i.e. diabetes, asthma, this includes High risk factors for pregnancy, etc.) ---Yes List chronic conditions. ---acid reflux Is the patient pregnant or possibly pregnant? (Ask all females between the ages of 55-55) ---No Is this a behavioral health or substance abuse call? ---NoPLEASE NOTE: All timestamps contained within this report are represented as [place] Standard Time. CONFIDENTIALTY NOTICE: This fax transmission is intended only for the addressee. It contains information that is legally privileged, confidential or otherwise protected from use or disclosure. If you are not the intended recipient, you are strictly prohibited from reviewing, disclosing, copying using or  disseminating any of this information or taking any action in reliance on or regarding this information. If you have received this fax in error, please notify us immediately by telephone so that we can arrange for its return to [place]. Phone: [phone], Toll-Free: [phone], Fax: [phone] Page: 2 of 2 Call Id: [id number] Guidelines Guideline Title Affirmed Question Affirmed Notes Nurse Date/Time Lamount Cohen Time) Chest Pain [1] Chest pain lasting < 5 minutes AND [2] NO chest pain or cardiac symptoms (e.g., breathing difficulty, sweating) now (Exception: chest pains that last only a few seconds) Freida Busman, RN, Diane 01/08/2020 12:03:48 PM Disp. Time Lamount Cohen Time) Disposition Final User 01/08/2020 11:58:32 AM Send to Urgent Queue Brooke Pace 01/08/2020 12:05:05 PM See PCP within 24 Hours Yes Freida Busman, RN, Diane Caller Disagree/Comply Comply Caller Understands Yes PreDisposition InappropriateToAsk Care Advice Given Per Guideline SEE PCP WITHIN 24 HOURS: * IF OFFICE WILL BE OPEN: You need to be seen within the next 24 hours. Call your doctor (or NP/PA) when the office opens and make an appointment. CALL BACK IF: * Difficulty breathing occurs * Chest pain increases in frequency, duration or severity * Chest pain lasts over 5 minutes * You become worse. CARE ADVICE given per Chest Pain (Adult)

## 2020-01-08 NOTE — Telephone Encounter (Signed)
FYI;  Pt has an upcoming an upcoming appointment with Samantha on 01/09/2020.

## 2020-01-08 NOTE — Telephone Encounter (Signed)
Noted  

## 2020-01-08 NOTE — Progress Notes (Signed)
h pylo

## 2020-01-09 ENCOUNTER — Encounter: Payer: Self-pay | Admitting: Physician Assistant

## 2020-01-09 ENCOUNTER — Ambulatory Visit (INDEPENDENT_AMBULATORY_CARE_PROVIDER_SITE_OTHER): Payer: 59 | Admitting: Physician Assistant

## 2020-01-09 ENCOUNTER — Ambulatory Visit: Payer: 59 | Admitting: Physician Assistant

## 2020-01-09 ENCOUNTER — Other Ambulatory Visit: Payer: Self-pay

## 2020-01-09 VITALS — BP 108/80 | HR 79 | Temp 98.0°F | Ht 62.0 in | Wt 145.0 lb

## 2020-01-09 DIAGNOSIS — R9431 Abnormal electrocardiogram [ECG] [EKG]: Secondary | ICD-10-CM

## 2020-01-09 DIAGNOSIS — R079 Chest pain, unspecified: Secondary | ICD-10-CM | POA: Diagnosis not present

## 2020-01-09 NOTE — Patient Instructions (Signed)
It was great to see you!  You will be contacted about your appointment to cardiology.  Please follow-up as you are supposed to with them.  If you develop any significant chest pain or changes in the interim, please proceed to the ER.  Get help right away if:  Your chest pain is worse.  You have a cough that gets worse, or you cough up blood.  You have very bad (severe) pain in your belly (abdomen).  You pass out (faint).  You have either of these for no clear reason: ? Sudden chest discomfort. ? Sudden discomfort in your arms, back, neck, or jaw.  You have shortness of breath at any time.  You suddenly start to sweat, or your skin gets clammy.  You feel sick to your stomach (nauseous).  You throw up (vomit).  You suddenly feel lightheaded or dizzy.  You feel very weak or tired.  Your heart starts to beat fast, or it feels like it is skipping beats. These symptoms may be an emergency. Do not wait to see if the symptoms will go away. Get medical help right away. Call your local emergency services (911 in the [place] Do not drive yourself to the hospital. Summary  Chest pain can be caused by many different conditions. The cause may be serious and need treatment right away. If you have chest pain, see your doctor right away.  Follow your doctor's instructions for taking medicines and making lifestyle changes.  Keep all follow-up visits as told by your doctor. This includes visits for any further testing if your chest pain does not go away.  Be sure to know the signs that show that your condition has become worse. Get help right away if you have these symptoms. This information is not intended to replace advice given to you by your health care provider. Make sure you discuss any questions you have with your health care provider. Document Revised: 02/24/2018 Document Reviewed: 02/24/2018 Elsevier Patient Education  2020 [employer].

## 2020-01-09 NOTE — Progress Notes (Signed)
Jenny Lane is a 34 y.o. female here for a new problem.  I acted as a Jenny administrator for [employer], PA-C Jenny Pickler, LPN   History of Present Illness:   Chief Complaint  Patient presents with  . Chest Pain    HPI   Chest pain Pt c/o left side chest pain yesterday she took a [employer] antacid and it was resolved. Denies shortness or breath or radiating down arm. Denies numbness or tingling, headaches or blurred vision. Also endorses some heaviness in center of chest. Symptoms do not worsen with activity. It is the second time it has happened, last time was two months ago, also was relieved with [employer] maalox. Pt says she has been burping more prior to antacid.   She was instructed by her PCP to hold her protonix for two weeks so an H Pylori breath test could be conducted. She has two more doses left.  She is fully COVID-19 vaccinated.  Past Medical History:  Diagnosis Date  . Depression   . History of chicken pox   . Tuberculosis    Received 9 Months of Treatement in 2006  . Typhoid fever   . Varicella    As a child      Social History   Socioeconomic History  . Marital status: Married    Spouse name: Not on file  . Number of children: Not on file  . Years of Jenny: Not on file  . Highest Jenny level: Not on file  Occupational History  . Not on file  Tobacco Use  . Smoking status: Never Smoker  . Smokeless tobacco: Never Used  Substance and Sexual Activity  . Alcohol use: No  . Drug use: No  . Sexual activity: Yes  Other Topics Concern  . Not on file  Social History Narrative  . Not on file   Social Determinants of Health   Financial Resource Strain:   . Difficulty of Paying Living Expenses:   Food Insecurity:   . Worried About Charity fundraiser in the Last Year:   . Arboriculturist in the Last Year:   Transportation Needs:   . Film/video editor (Medical):   Jenny Lane Lack of Transportation (Non-Medical):   Physical Activity:   . Days of Exercise per  Week:   . Minutes of Exercise per Session:   Stress:   . Feeling of Stress :   Social Connections:   . Frequency of Communication with Friends and Family:   . Frequency of Social Gatherings with Friends and Family:   . Attends Religious Services:   . Active Member of Clubs or Organizations:   . Attends Archivist Meetings:   Jenny Lane Marital Status:   Intimate Partner Violence:   . Fear of Current or Ex-Partner:   . Emotionally Abused:   Jenny Lane Physically Abused:   . Sexually Abused:     History reviewed. No pertinent surgical history.  Family History  Problem Relation Age of Onset  . Diabetes Father     Allergies  Allergen Reactions  . Biaxin [Clarithromycin] Anaphylaxis  . Cefdinir Hives    Sneezing, swollen lips, and hives.  Last taken 2 months ago for a sinus infection.   . Clindamycin/Lincomycin Hives    Patient experienced low back pain, hives, and a dark line around her bottom lip when taking medication.   . Sulfa Antibiotics Hives and Swelling    Lips Swell.  Last taken during childhood.   . Tetracyclines &  Related     Childhood reaction    Current Medications:   Current Outpatient Medications:  .  Calcium Carbonate 500 MG CHEW, Chew 2 each by mouth daily., Disp: , Rfl:  .  Cholecalciferol 25 MCG (1000 UT) tablet, Take by mouth., Disp: , Rfl:  .  Cyanocobalamin (B-12) 500 MCG TABS, Take 500 mcg by mouth daily., Disp: , Rfl:  .  Multiple Vitamins-Minerals (OPTIVITE P.M.T.) TABS, , Disp: , Rfl:  .  pantoprazole (PROTONIX) 40 MG tablet, Take 40 mg by mouth daily., Disp: , Rfl:    Review of Systems:   ROS  Negative unless otherwise specified per HPI.  Vitals:   Vitals:   01/09/20 0800  BP: 108/80  Pulse: 79  Temp: 98 F (36.7 C)  TempSrc: Temporal  SpO2: 97%  Weight: 145 lb (65.8 kg)  Height: 5\' 2"  (1.575 m)     Body mass index is 26.52 kg/m.  Physical Exam:   Physical Exam Vitals and nursing note reviewed.  Constitutional:      General: She  is not in acute distress.    Appearance: She is well-developed. She is not ill-appearing or toxic-appearing.  Cardiovascular:     Rate and Rhythm: Normal rate and regular rhythm.     Pulses: Normal pulses.     Heart sounds: Normal heart sounds, S1 normal and S2 normal.     Comments: No LE edema Pulmonary:     Effort: Pulmonary effort is normal.     Breath sounds: Normal breath sounds.  Abdominal:     General: Abdomen is flat. Bowel sounds are normal.     Palpations: Abdomen is soft.     Tenderness: There is no abdominal tenderness.  Skin:    General: Skin is warm and dry.  Neurological:     Mental Status: She is alert.     GCS: GCS eye subscore is 4. GCS verbal subscore is 5. GCS motor subscore is 6.  Psychiatric:        Speech: Speech normal.        Behavior: Behavior normal. Behavior is cooperative.       Assessment and Plan:   Jenny Lane was seen today for chest pain.  Diagnoses and all orders for this visit:  Chest pain, unspecified type; EKG abnormalities EKG tracing is personally reviewed.  EKG notes NSR.  Compared EKG to priors. Poor R-wave progression and inverted T-waves in V3 and V4. Reviewed with supervising physician Dr. Kenney Lane. Will refer patient to cardiology for further evaluation and management. Patient denies any current active chest pain. Strict precautions to proceed to ER if she develops any new chest pain, SOB, or other concerning symptoms. Patient agreeable to plan. Written precautions provided on AVS. Vitals currently WNL. -     EKG 12-Lead -     Ambulatory referral to Cardiology  . Reviewed expectations re: course of current medical issues. . Discussed self-management of symptoms. . Outlined signs and symptoms indicating need for more acute intervention. . Patient verbalized understanding and all questions were answered. . See orders for this visit as documented in the electronic medical record. . Patient received an After-Visit Summary.  CMA or  LPN served as scribe during this visit. History, Physical, and Plan performed by medical provider. The above documentation has been reviewed and is accurate and complete.  Jenny Doe, PA-C

## 2020-01-17 ENCOUNTER — Other Ambulatory Visit: Payer: 59

## 2020-01-18 NOTE — Progress Notes (Signed)
Cardiology Office Note:   Date:  01/19/2020  NAME:  Jenny Lane    MRN: [id number] DOB:  Aug 15, 1986   PCP:  Orland Mustard, MD  Cardiologist:  Reatha Harps, MD   Referring MD: Jarold Motto, [place]   Chief Complaint  Patient presents with  . Chest Pain    History of Present Illness:   Jenny Lane is a 34 y.o. female with a hx of depression who is being seen today for the evaluation of chest pain at the request of Orland Mustard, MD. Evaluated by PCP for GERD symptoms that have improved with antacids. Concerns for abnormal EKG and referred to [place].   She presents after several episodes of chest pain.  She does struggle with acid reflux and is being worked up for possible H. pylori.  She was seen by her primary care physician for what she describes as burning in her chest.  Apparently around 2 to 3 weeks ago she did have an episode where she felt some pressure in her chest.  She reports this lasted for several hours.  It was not associated with activity and not alleviated by rest.  She reports it was associated with belching.  She reports has had intermittent symptoms like this for some time but modification of diet improving her heartburn medications has always helped.  There was concerns for poor R wave progression and T wave inversions on her EKG.  Today her EKG demonstrates nonspecific ST-T changes which are likely related to breast artifact.  She has no symptoms that are cardiac.  She reports she is exercise routinely but has no limitations such as chest pain with exertion or shortness of breath.  She denies any lower extremity edema.  There is no history of diabetes.  Her most recent lipid profile demonstrates total cholesterol 161, HDL 52, LDL 96, triglycerides 67, K9F 5.5.  Her most recent thyroid studies show a TSH of 1.52.  There is no strong family history of heart disease.  She does not smoke or consume alcohol.  No illicit drug use reported.  She is a stay-at-home mom.  Past Medical  History: Past Medical History:  Diagnosis Date  . Depression   . History of chicken pox   . Tuberculosis    Received 9 Months of Treatement in 2006  . Typhoid fever   . Varicella    As a child    Past Surgical History: Past Surgical History:  Procedure Laterality Date  . VAGINAL DELIVERY      Current Medications: Current Meds  Medication Sig  . Calcium Carbonate 500 MG CHEW Chew 2 each by mouth daily.  . Cholecalciferol 25 MCG (1000 UT) tablet Take by mouth.  . Cyanocobalamin (B-12) 500 MCG TABS Take 500 mcg by mouth daily.  . Multiple Vitamins-Minerals (OPTIVITE P.M.T.) TABS      Allergies:    Biaxin [clarithromycin], Cefdinir, Clindamycin/lincomycin, Sulfa antibiotics, and Tetracyclines & related   Social History: Social History   Socioeconomic History  . Marital status: Married    Spouse name: Not on file  . Number of children: 2  . Years of education: Not on file  . Highest education level: Not on file  Occupational History  . Occupation: stay at home mom  Tobacco Use  . Smoking status: Never Smoker  . Smokeless tobacco: Never Used  Substance and Sexual Activity  . Alcohol use: No  . Drug use: No  . Sexual activity: Yes  Other Topics Concern  . Not on  file  Social History Narrative  . Not on file   Social Determinants of Health   Financial Resource Strain:   . Difficulty of Paying Living Expenses:   Food Insecurity:   . Worried About Programme researcher, broadcasting/film/video in the Last Year:   . Barista in the Last Year:   Transportation Needs:   . Freight forwarder (Medical):   Marland Kitchen Lack of Transportation (Non-Medical):   Physical Activity:   . Days of Exercise per Week:   . Minutes of Exercise per Session:   Stress:   . Feeling of Stress :   Social Connections:   . Frequency of Communication with Friends and Family:   . Frequency of Social Gatherings with Friends and Family:   . Attends Religious Services:   . Active Member of Clubs or Organizations:    . Attends Banker Meetings:   Marland Kitchen Marital Status:     Family History: The patient's family history includes Diabetes in her father.  ROS:   All other ROS reviewed and negative. Pertinent positives noted in the HPI.     EKGs/Labs/Other Studies Reviewed:   The following studies were personally reviewed by me today:  EKG:  EKG is ordered today.  The ekg ordered today demonstrates normal sinus rhythm, heart rate 76, no acute ST-T changes, nonspecific ST-T changes, no evidence of prior infarction, and was personally reviewed by me.   Recent Labs: 10/25/2019: Hemoglobin 13.0; Platelets 272 12/22/2019: ALT 18; BUN 10; Creatinine, Ser 0.74; Potassium 4.4; Sodium 137; TSH 1.52   Recent Lipid Panel    Component Value Date/Time   CHOL 161 12/22/2019 1445   TRIG 67.0 12/22/2019 1445   HDL 52.20 12/22/2019 1445   CHOLHDL 3 12/22/2019 1445   VLDL 13.4 12/22/2019 1445   LDLCALC 96 12/22/2019 1445    Physical Exam:   VS:  BP 120/74   Pulse 76   Ht 5\' 2"  (1.575 m)   Wt 146 lb (66.2 kg)   LMP 12/30/2019   SpO2 99%   BMI 26.70 kg/m    Wt Readings from Last 3 Encounters:  01/19/20 146 lb (66.2 kg)  01/09/20 145 lb (65.8 kg)  12/22/19 147 lb 12.8 oz (67 kg)    General: Well nourished, well developed, in no acute distress Heart: Atraumatic, normal size  Eyes: PEERLA, EOMI  Neck: Supple, no JVD Endocrine: No thryomegaly Cardiac: Normal S1, S2; RRR; no murmurs, rubs, or gallops Lungs: Clear to auscultation bilaterally, no wheezing, rhonchi or rales  Abd: Soft, nontender, no hepatomegaly  Ext: No edema, pulses 2+ Musculoskeletal: No deformities, BUE and BLE strength normal and equal Skin: Warm and dry, no rashes   Neuro: Alert and oriented to person, place, time, and situation, CNII-XII grossly intact, no focal deficits  Psych: Normal mood and affect   ASSESSMENT:   Jenny Lane is a 34 y.o. female who presents for the following: 1. Chest pain, unspecified type   2.  Nonspecific abnormal electrocardiogram (ECG) (EKG)     PLAN:   1. Chest pain, unspecified type -Atypical.  Consistent with acid reflux.  She recently stopped her Protonix to get tested for H. pylori.  Likely cause exacerbation.  Symptoms of pressure as well as any pain in the chest are very common with acid reflux and she has no exertional symptoms concerning for angina.  Her EKG demonstrates nonspecific ST-T changes which are likely consistent with breast artifact.  I see no need for further testing  given her very normal cardiac examination and absence of cardiac symptoms.  The tracing will also vary depending on how the quality is.  2. Nonspecific abnormal electrocardiogram (ECG) (EKG) -Nonspecific ST-T changes which really are not that alarming here.  There is no T wave inversions noted in the anterior leads and her R wave progression is likely normal.  Given her lack of symptoms and normal cardiovascular examination including no murmurs rubs or gallops I recommended no further testing.  She will see [place] on an as-needed basis.  Disposition: Return if symptoms worsen or fail to improve.  Medication Adjustments/Labs and Tests Ordered: Current medicines are reviewed at length with the patient today.  Concerns regarding medicines are outlined above.  Orders Placed This Encounter  Procedures  . EKG 12-Lead   No orders of the defined types were placed in this encounter.   Patient Instructions  Medication Instructions:  The current medical regimen is effective;  continue present plan and medications.  *If you need a refill on your cardiac medications before your next appointment, please call your pharmacy*   Follow-Up: At [facility], you and your health needs are our priority.  As part of our continuing mission to provide you with exceptional heart care, we have created designated Provider Care Teams.  These Care Teams include your primary Cardiologist (physician) and Advanced Practice  Providers (APPs -  Physician Assistants and Nurse Practitioners) who all work together to provide you with the care you need, when you need it.  We recommend signing up for the patient portal called "MyChart".  Sign up information is provided on this After Visit Summary.  MyChart is used to connect with patients for Virtual Visits (Telemedicine).  Patients are able to view lab/test results, encounter notes, upcoming appointments, etc.  Non-urgent messages can be sent to your provider as well.   To learn more about what you can do with MyChart, go to [email or web address].    Your next appointment:   As needed  The format for your next appointment:   In Person  Provider:   Eleonore Chiquito, MD      Signed, Addison Naegeli. Audie Box, [place]  [address], [place], [place] [address] [phone]  01/19/2020 11:16 AM

## 2020-01-19 ENCOUNTER — Ambulatory Visit (INDEPENDENT_AMBULATORY_CARE_PROVIDER_SITE_OTHER): Payer: 59 | Admitting: Cardiovascular Disease

## 2020-01-19 ENCOUNTER — Other Ambulatory Visit: Payer: Self-pay

## 2020-01-19 ENCOUNTER — Encounter: Payer: Self-pay | Admitting: Cardiovascular Disease

## 2020-01-19 VITALS — BP 120/74 | HR 76 | Ht 62.0 in | Wt 146.0 lb

## 2020-01-19 DIAGNOSIS — R9431 Abnormal electrocardiogram [ECG] [EKG]: Secondary | ICD-10-CM | POA: Diagnosis not present

## 2020-01-19 DIAGNOSIS — R079 Chest pain, unspecified: Secondary | ICD-10-CM | POA: Diagnosis not present

## 2020-01-19 NOTE — Patient Instructions (Signed)
Medication Instructions:  The current medical regimen is effective;  continue present plan and medications.  *If you need a refill on your cardiac medications before your next appointment, please call your pharmacy*    Follow-Up: At [facility], you and your health needs are our priority.  As part of our continuing mission to provide you with exceptional heart care, we have created designated Provider Care Teams.  These Care Teams include your primary Cardiologist (physician) and Advanced Practice Providers (APPs -  Physician Assistants and Nurse Practitioners) who all work together to provide you with the care you need, when you need it.  We recommend signing up for the patient portal called "MyChart".  Sign up information is provided on this After Visit Summary.  MyChart is used to connect with patients for Virtual Visits (Telemedicine).  Patients are able to view lab/test results, encounter notes, upcoming appointments, etc.  Non-urgent messages can be sent to your provider as well.   To learn more about what you can do with MyChart, go to [email or web address].    Your next appointment:   As needed  The format for your next appointment:   In Person  Provider:   [place] O'[place]al, MD      

## 2020-01-26 ENCOUNTER — Other Ambulatory Visit: Payer: Self-pay

## 2020-01-26 ENCOUNTER — Other Ambulatory Visit: Payer: 59

## 2020-01-26 DIAGNOSIS — R1013 Epigastric pain: Secondary | ICD-10-CM

## 2020-01-29 ENCOUNTER — Encounter: Payer: Self-pay | Admitting: Family Medicine

## 2020-01-29 LAB — H. PYLORI BREATH TEST: H. pylori Breath Test: NOT DETECTED

## 2020-09-03 ENCOUNTER — Encounter: Payer: Self-pay | Admitting: Family Medicine

## 2020-09-09 ENCOUNTER — Telehealth (INDEPENDENT_AMBULATORY_CARE_PROVIDER_SITE_OTHER): Payer: 59 | Admitting: Physician Assistant

## 2020-09-09 ENCOUNTER — Encounter: Payer: Self-pay | Admitting: Physician Assistant

## 2020-09-09 VITALS — Ht 62.0 in | Wt 121.0 lb

## 2020-09-09 DIAGNOSIS — H5789 Other specified disorders of eye and adnexa: Secondary | ICD-10-CM | POA: Diagnosis not present

## 2020-09-09 DIAGNOSIS — H5713 Ocular pain, bilateral: Secondary | ICD-10-CM

## 2020-09-09 MED ORDER — POLYMYXIN B-TRIMETHOPRIM 10000-0.1 UNIT/ML-% OP SOLN: 1.0000 [drp] | Freq: Four times a day (QID) | OPHTHALMIC | 0 refills | Status: DC

## 2020-09-09 NOTE — Progress Notes (Signed)
Virtual Visit via Video   I connected with Jenny Lane on 09/09/20 at  1:30 PM EST by a video enabled telemedicine application and verified that I am speaking with the correct person using two identifiers. Location patient: Home Location provider: [facility], Office Persons participating in the virtual visit: Jenny Lane, Jarold Motto PA-C, Jenny Mull, LPN   I discussed the limitations of evaluation and management by telemedicine and the availability of in person appointments. The patient expressed understanding and agreed to proceed.  I acted as a Neurosurgeon for [name], [employer], LPN   Subjective:   HPI:   Eye problem Pt c/o redness, itching and swelling of right eye. Pt has been using Clear eyes eye drops without significant change. Symptoms are not worsening with time. Started a week ago approximately 1 week after booster vaccination. Denies: vision changes, pain, crusting, fever, chills, recent URI.  ROS: See pertinent positives and negatives per HPI.  Patient Active Problem List   Diagnosis Date Noted  . 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 Tuberculosis 12/22/2019    Social History   Tobacco Use  . Smoking status: Never Smoker  . Smokeless tobacco: Never Used  Substance Use Topics  . Alcohol use: No    Current Outpatient Medications:  .  Cholecalciferol 25 MCG (1000 UT) tablet, Take by mouth., Disp: , Rfl:  .  Cyanocobalamin (B-12) 500 MCG TABS, Take 500 mcg by mouth daily., Disp: , Rfl:  .  Multiple Vitamins-Minerals (OPTIVITE P.M.T.) TABS, , Disp: , Rfl:   Allergies  Allergen Reactions  . Biaxin [Clarithromycin] Anaphylaxis  . Cefdinir Hives    Sneezing, swollen lips, and hives.  Last taken 2 months ago for a sinus infection.   . Clindamycin/Lincomycin Hives    Patient experienced low back pain, hives, and a dark line around her bottom lip when taking medication.   . Sulfa Antibiotics Hives and Swelling    Lips Swell.  Last taken during  childhood.   . Tetracyclines & Related     Childhood reaction    Objective:   VITALS: Per patient if applicable, see vitals. GENERAL: Alert, appears well and in no acute distress. HEENT: Atraumatic, conjunctiva clear, no obvious abnormalities on inspection of external nose and ears. NECK: Normal movements of the head and neck. CARDIOPULMONARY: No increased WOB. Speaking in clear sentences. I:E ratio WNL.  MS: Moves all visible extremities without noticeable abnormality. PSYCH: Pleasant and cooperative, well-groomed. Speech normal rate and rhythm. Affect is appropriate. Insight and judgement are appropriate. Attention is focused, linear, and appropriate.  NEURO: CN grossly intact. Oriented as arrived to appointment on time with no prompting. Moves both UE equally.  SKIN: No obvious lesions, wounds, erythema, or cyanosis noted on face or hands.  Assessment and Plan:   Rhia was seen today for eye problem.  Diagnoses and all orders for this visit:  Itch of right eye   Difficult to assess virtually however there is no evidence of significant swelling or decreased EOM. Will trial topical polytrim to cover for any bacterial infection. May continue benadryl prn. Also recommend conservative measures for blepharitis -- compresses and baby shampoo to lids to cleanse. MyChart message sent with more information. Follow-up with PCP or eye doctor for any further concerns.  I discussed the assessment and treatment plan with the patient. The patient was provided an opportunity to ask questions and all were answered. The patient agreed with the plan and demonstrated an understanding of the instructions.   The  patient was advised to call back or seek an in-person evaluation if the symptoms worsen or if the condition fails to improve as anticipated.   CMA or LPN served as scribe during this visit. History, Physical, and Plan performed by medical provider. The above documentation has been reviewed and  is accurate and complete.  [place], [place] 09/09/2020

## 2020-10-03 ENCOUNTER — Encounter: Payer: Self-pay | Admitting: Family Medicine

## 2020-10-03 ENCOUNTER — Other Ambulatory Visit: Payer: Self-pay

## 2020-10-03 ENCOUNTER — Ambulatory Visit (INDEPENDENT_AMBULATORY_CARE_PROVIDER_SITE_OTHER): Payer: 59 | Admitting: Family Medicine

## 2020-10-03 VITALS — BP 114/80 | HR 87 | Temp 98.2°F | Ht 62.0 in | Wt 143.2 lb

## 2020-10-03 DIAGNOSIS — B9689 Other specified bacterial agents as the cause of diseases classified elsewhere: Secondary | ICD-10-CM | POA: Diagnosis not present

## 2020-10-03 DIAGNOSIS — N760 Acute vaginitis: Secondary | ICD-10-CM

## 2020-10-03 DIAGNOSIS — H5789 Other specified disorders of eye and adnexa: Secondary | ICD-10-CM

## 2020-10-03 DIAGNOSIS — R1011 Right upper quadrant pain: Secondary | ICD-10-CM

## 2020-10-03 MED ORDER — METRONIDAZOLE 500 MG PO TABS: 500.0000 mg | ORAL_TABLET | Freq: Two times a day (BID) | ORAL | 0 refills | Status: DC

## 2020-10-03 NOTE — Patient Instructions (Addendum)
-will need to CT belly if pain continues or if you feel like it's sticking out more. Please let me know. Would rather CT belly than wait on this.   -could go see optometrist for better look at your eyes. No scratch on exam today. I did put in referral to ophthalmologist.    Good to see you! Dr. Artis Flock  -   Other eye thing you can try is refresh pm at night.   -for right rib pain.. voltaren over the counter. Can use up to four times a day.    Vaginal odor is called bacterial vaginosis. Pick up medication. You take it twice a day for 7 days.    Bacterial Vaginosis  Bacterial vaginosis is an infection of the vagina. It happens when too many normal germs (healthy bacteria) grow in the vagina. This infection can make it easier to get other infections from sex (STIs). It is very important for pregnant women to get treated. This infection can cause babies to be born early or at a low birth weight. What are the causes? This infection is caused by an increase in certain germs that grow in the vagina. You cannot get this infection from toilet seats, bedsheets, swimming pools, or things that touch your vagina. What increases the risk?  Having sex with a new person or more than one person.  Having sex without protection.  Douching.  Having an intrauterine device (IUD).  Smoking.  Using drugs or drinking alcohol. These can lead you to do things that are risky.  Taking certain antibiotic medicines.  Being pregnant. What are the signs or symptoms? Some women have no symptoms. Symptoms may include:  A discharge from your vagina. It may be gray or white. It can be watery or foamy.  A fishy smell. This can happen after sex or during your menstrual period.  Itching in and around your vagina.  A feeling of burning or pain when you pee (urinate). How is this treated? This infection is treated with antibiotic medicines. These may be given to you as:  A pill.  A cream for your  vagina.  A medicine that you put into your vagina (suppository). If the infection comes back after treatment, you may need more antibiotics. Follow these instructions at home: Medicines  Take over-the-counter and prescription medicines as told by your doctor.  Take or use your antibiotic medicine as told by your doctor. Do not stop taking or using it, even if you start to feel better. General instructions  If the person you have sex with is a woman, tell her that you have this infection. She will need to follow up with her doctor. If you have a female partner, he does not need to be treated.  Do not have sex until you finish treatment.  Drink enough fluid to keep your pee pale yellow.  Keep your vagina and butt clean. ? Wash the area with warm water each day. ? Wipe from front to back after you use the toilet.  If you are breastfeeding a baby, ask your doctor if you should keep doing so during treatment.  Keep all follow-up visits. How is this prevented? Self-care  Do not douche.  Use only warm water to wash around your vagina.  Wear underwear that is cotton or lined with cotton.  Do not wear tight pants and pantyhose, especially in the summer. Safe sex  Use protection when you have sex. This includes: ? Use condoms. ? Use dental dams. This  is a thin layer that protects the mouth during oral sex.  Limit how many people you have sex with. To prevent this infection, it is best to have sex with just one person.  Get tested for STIs. The person you have sex with should also get tested. Drugs and alcohol  Do not smoke or use any products that contain nicotine or tobacco. If you need help quitting, ask your doctor.  Do not use drugs.  Do not drink alcohol if: ? Your doctor tells you not to drink. ? You are pregnant, may be pregnant, or are planning to become pregnant.  If you drink alcohol: ? Limit how much you have to 0-1 drink a day. ? Know how much alcohol is in  your drink. In the [place] one drink equals one 12 oz bottle of beer (355 mL), one 5 oz glass of wine (148 mL), or one 1 oz glass of hard liquor (44 mL). Where to find more information  Centers for Disease Control and Prevention: FootballExhibition.com.br  American Sexual Health Association: www.ashastd.org  Office on [employer] Health: http://hoffman.com/ Contact a doctor if:  Your symptoms do not get better, even after you are treated.  You have more discharge or pain when you pee.  You have a fever or chills.  You have pain in your belly (abdomen) or in the area between your hips.  You have pain with sex.  You bleed from your vagina between menstrual periods. Summary  This infection can happen when too many germs (bacteria) grow in the vagina.  This infection can make it easier to get infections from sex (STIs). Treating this can lower that chance.  Get treated if you are pregnant. This infection can cause babies to be born early.  Do not stop taking or using your antibiotic medicine, even if you start to feel better. This information is not intended to replace advice given to you by your health care provider. Make sure you discuss any questions you have with your health care provider. Document Revised: 02/22/2020 Document Reviewed: 02/22/2020 Elsevier Patient Education  2021 [employer].

## 2020-10-03 NOTE — Progress Notes (Signed)
Patient: Jenny Lane MRN: [id number] DOB: 02-13-1986 PCP: Orland Mustard, MD     Subjective:  Chief Complaint  Patient presents with  . Conjunctivitis    Since having Covid vaccine.  . Flank Pain    Since Covid vaccine. 12/22 or 23  . vaginal odor    HPI: The patient is a 35 y.o. female who presents today for eye redness and itching after getting Booster vaccination. She says that it comes and goes. She has been using eye drops, and seems to have helped some. She had some itching in her right eye as well and intermittently in the left eye. Was seen by PA on 1/3 and given topical antibiotic cream and she thinks it helped. She states  Her eye still feels itching on the inside. No vision changes, but has some mucous that comes out of her eye in the AM and evening. No blurry vision or light sensitivity. No double vision. She has been using thera tears and this helps as well. No contacts.   She also has some intermittent pain in her right side of her abdomen. Pain is a 5/10 and it comes and goes. She states its "uneasy." She has no diarrhea, nausea and vomiting. She does have some burps with sour tasting. She doesn't have any epigastric pain or burning up her chest. Has been happening past 2-3 days. No change in her stool patterns. Nothing makes her pain better or worse. She is not exercising. I have done an ultrasound and checked for h.pylori. both unremarkable. Also feels like her stomach is more bloated.    She also complains of a vaginal odor like fish since her last baby three years ago. She states it comes and goes. She told her OB, wasn't given any medication.    Review of Systems  Constitutional: Negative for chills and fever.  Eyes: Positive for redness and itching. Negative for photophobia and visual disturbance.  Respiratory: Negative for cough and shortness of breath.   Gastrointestinal: Positive for abdominal pain. Negative for diarrhea, nausea and vomiting.  Genitourinary:        Vaginal odor.     Allergies Patient is allergic to biaxin [clarithromycin], cefdinir, clindamycin/lincomycin, sulfa antibiotics, and tetracyclines & related.  Past Medical History Patient  has a past medical history of Depression, History of chicken pox, Tuberculosis, Typhoid fever, and Varicella.  Surgical History Patient  has a past surgical history that includes Vaginal delivery.  Family History Pateint's family history includes Diabetes in her father.  Social History Patient  reports that she has never smoked. She has never used smokeless tobacco. She reports that she does not drink alcohol and does not use drugs.    Objective: Vitals:   10/03/20 0944  BP: 114/80  Pulse: 87  Temp: 98.2 F (36.8 C)  TempSrc: Temporal  SpO2: 97%  Weight: 143 lb 3.2 oz (65 kg)  Height: 5\' 2"  (1.575 m)    Body mass index is 26.19 kg/m.  Physical Exam Vitals reviewed.  Constitutional:      Appearance: Normal appearance. She is obese.  HENT:     Head: Normocephalic and atraumatic.     Right Ear: Tympanic membrane, ear canal and external ear normal.     Left Ear: Tympanic membrane, ear canal and external ear normal.  Eyes:     General: No scleral icterus.       Right eye: No discharge.        Left eye: No discharge.  Extraocular Movements: Extraocular movements intact.     Pupils: Pupils are equal, round, and reactive to light.     Comments: Woods lamp negative with fluorcein strip. Very mild erythema in sclera, but overall normal exam.   Cardiovascular:     Rate and Rhythm: Normal rate and regular rhythm.     Heart sounds: Normal heart sounds.  Pulmonary:     Effort: Pulmonary effort is normal.     Breath sounds: Normal breath sounds.  Abdominal:     General: Bowel sounds are normal.     Palpations: Abdomen is soft.     Tenderness: There is abdominal tenderness (right lower rib cage. negative murphys sign ).  Musculoskeletal:     Cervical back: Normal range of motion and  neck supple.  Lymphadenopathy:     Cervical: No cervical adenopathy.  Neurological:     General: No focal deficit present.     Mental Status: She is alert and oriented to person, place, and time.  Psychiatric:        Mood and Affect: Mood normal.        Behavior: Behavior normal.        Assessment/plan: 1. Itchy, watery, and red eye -no sign of corneal or scleral cut. Continue with thera tears daily and can try refresh pm in evening. I did do referral for optho, but also suggested she could see optometrist as well. Precautions given for vision changes, diplopia, redness, etc.   2. Bacterial vaginosis Course of flagyl.   3. Right upper quadrant abdominal pain Exam unremarkable. Has been worked up in past with unremarkable abdo ultrasound and labs. Did discuss CT today. She will let me know if she decides to do this. Discussed if she feels like her stomach is bloating and pain continues we need to get this ordered.     This visit occurred during the SARS-CoV-2 public health emergency.  Safety protocols were in place, including screening questions prior to the visit, additional usage of staff PPE, and extensive cleaning of exam room while observing appropriate contact time as indicated for disinfecting solutions.     Return if symptoms worsen or fail to improve.    Orland Mustard, MD  [facility]   10/03/2020

## 2020-10-28 ENCOUNTER — Encounter: Payer: Self-pay | Admitting: Family Medicine

## 2020-12-06 ENCOUNTER — Encounter: Payer: Self-pay | Admitting: Family Medicine

## 2020-12-10 ENCOUNTER — Other Ambulatory Visit: Payer: Self-pay

## 2020-12-10 ENCOUNTER — Encounter: Payer: Self-pay | Admitting: Physician Assistant

## 2020-12-10 ENCOUNTER — Ambulatory Visit (INDEPENDENT_AMBULATORY_CARE_PROVIDER_SITE_OTHER): Payer: 59 | Admitting: Physician Assistant

## 2020-12-10 VITALS — Ht 62.0 in | Wt 150.0 lb

## 2020-12-10 DIAGNOSIS — R0981 Nasal congestion: Secondary | ICD-10-CM | POA: Diagnosis not present

## 2020-12-10 NOTE — Progress Notes (Signed)
Virtual Visit via Video   I connected with Jenny Lane on 12/10/20 at 11:30 AM EDT by a video enabled telemedicine application and verified that I am speaking with the correct person using two identifiers. Location patient: Home Location provider: [facility], Office Persons participating in the virtual visit: [place], [place] PA-C  I discussed the limitations of evaluation and management by telemedicine and the availability of in person appointments. The patient expressed understanding and agreed to proceed.  Subjective:   HPI:   URI symptoms Pt c/o runny nose with yellow drainage, sore throat and fever last week. Her sore throat and fever have resolved. Denies headache, body aches, poor appetite, n/v/d, chills. Husband's COVID test positive on 4/1, her home COVID test was negative, however she states that she [facility] and is still waiting for results.  She has tried mucinex with little relief. Her 35 yo currently has fever.  She does have significant hx of allergies, currently not on medications.  ROS: See pertinent positives and negatives per HPI.  Patient Active Problem List   Diagnosis Date Noted  . 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 Tuberculosis 12/22/2019    Social History   Tobacco Use  . Smoking status: Never Smoker  . Smokeless tobacco: Never Used  Substance Use Topics  . Alcohol use: No    Current Outpatient Medications:  .  Cholecalciferol 25 MCG (1000 UT) tablet, Take by mouth., Disp: , Rfl:  .  Cyanocobalamin (B-12) 500 MCG TABS, Take 500 mcg by mouth daily., Disp: , Rfl:  .  Multiple Vitamins-Minerals (OPTIVITE P.M.T.) TABS, , Disp: , Rfl:   Allergies  Allergen Reactions  . Biaxin [Clarithromycin] Anaphylaxis  . Cefdinir Hives    Sneezing, swollen lips, and hives.  Last taken 2 months ago for a sinus infection.   . Clindamycin/Lincomycin Hives    Patient experienced low back pain, hives,  and a dark line around her bottom lip when taking medication.   . Sulfa Antibiotics Hives and Swelling    Lips Swell.  Last taken during childhood.   . Tetracyclines & Related     Childhood reaction    Objective:   VITALS: Per patient if applicable, see vitals. GENERAL: Alert, appears well and in no acute distress. HEENT: Atraumatic, conjunctiva clear, no obvious abnormalities on inspection of external nose and ears. NECK: Normal movements of the head and neck. CARDIOPULMONARY: No increased WOB. Speaking in clear sentences. I:E ratio WNL.  MS: Moves all visible extremities without noticeable abnormality. PSYCH: Pleasant and cooperative, well-groomed. Speech normal rate and rhythm. Affect is appropriate. Insight and judgement are appropriate. Attention is focused, linear, and appropriate.  NEURO: CN grossly intact. Oriented as arrived to appointment on time with no prompting. Moves both UE equally.  SKIN: No obvious lesions, wounds, erythema, or cyanosis noted on face or hands.  Assessment and Plan:   Tephanie was seen today for covid exposure.  Diagnoses and all orders for this visit:  Nasal congestion   No red flags on discussion.  I do not feel as though an abx is warranted at this time. Main symptom she is concerned about at this time is congestion. Advised as follows:  May use over the counter antihistamines such as Zyrtec (cetirizine), Claritin (loratadine), Allegra (fexofenadine), or Xyzal (levocetirizine) daily as needed. May take twice a day if needed as long as it does not cause drowsiness.  Start Flonase 1 spray twice a day.  Nasal saline spray (i.e., Simply Saline) or nasal saline lavage (i.e., NeilMed) is recommended as needed and prior to medicated nasal sprays.   I have also asked her to let us know the results of the COVID test. Will defer COVID positive result management to PCP.  Discussed taking medications as prescribed. Reviewed return precautions including  worsening fever, SOB, worsening cough or other concerns. Push fluids and rest. I recommend that patient follow-up if symptoms worsen or persist despite treatment x 7-10 days, sooner if needed.  I discussed the assessment and treatment plan with the patient. The patient was provided an opportunity to ask questions and all were answered. The patient agreed with the plan and demonstrated an understanding of the instructions.   The patient was advised to call back or seek an in-person evaluation if the symptoms worsen or if the condition fails to improve as anticipated.   CMA or LPN served as scribe during this visit. History, Physical, and Plan performed by medical provider. The above documentation has been reviewed and is accurate and complete.  [place], [place] 12/10/2020

## 2020-12-16 ENCOUNTER — Encounter: Payer: Self-pay | Admitting: Family Medicine

## 2020-12-17 MED ORDER — BUDESONIDE 180 MCG/ACT IN AEPB: 2.0000 | INHALATION_SPRAY | Freq: Two times a day (BID) | RESPIRATORY_TRACT | 0 refills | Status: DC

## 2021-02-28 IMAGING — US US ABDOMEN COMPLETE
1 series · 14 of 25 positions shown · non-contrast
Comparison: None.

CLINICAL DATA: Right upper quadrant pain for 2 weeks

EXAM:
ABDOMEN ULTRASOUND COMPLETE

[Series 1: us abdomen complete · 0.15mm/px · 14 of 75 slices shown]
[im 1/75]
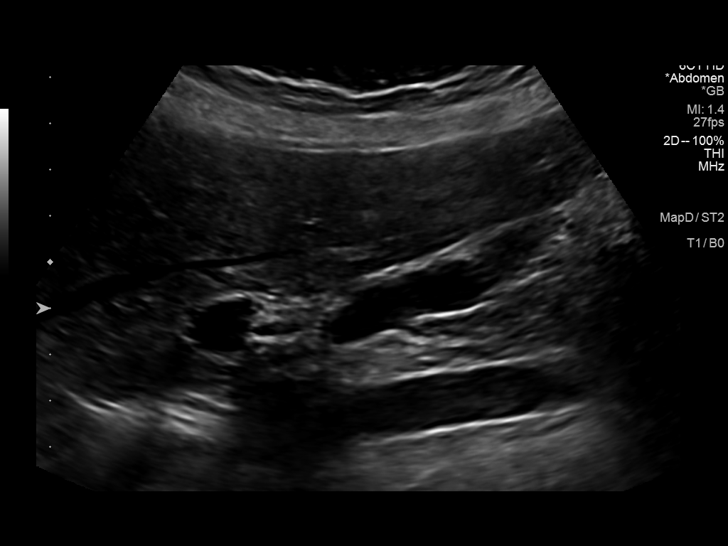
[im 7/75]
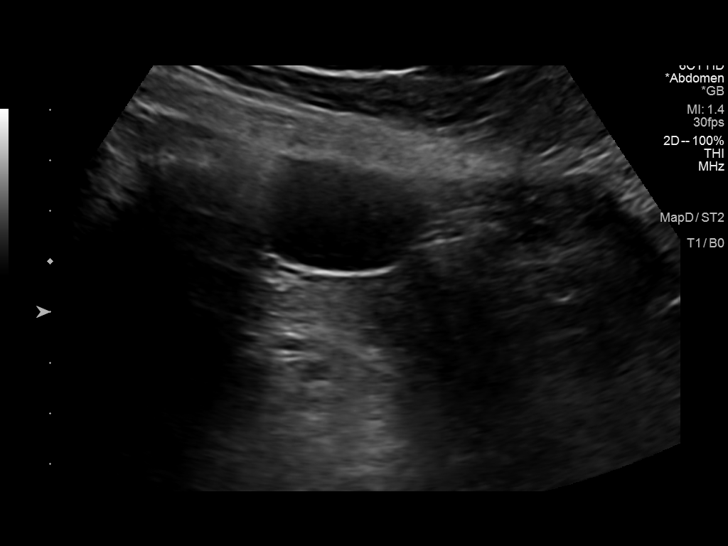
[im 13/75]
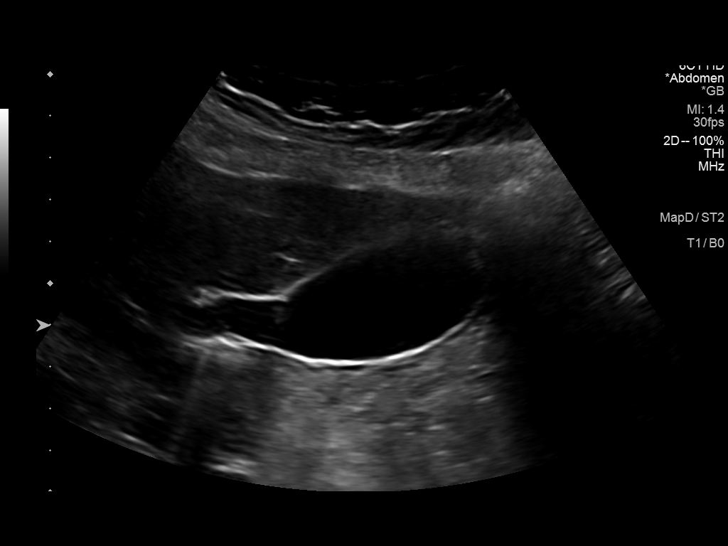
[im 19/75]
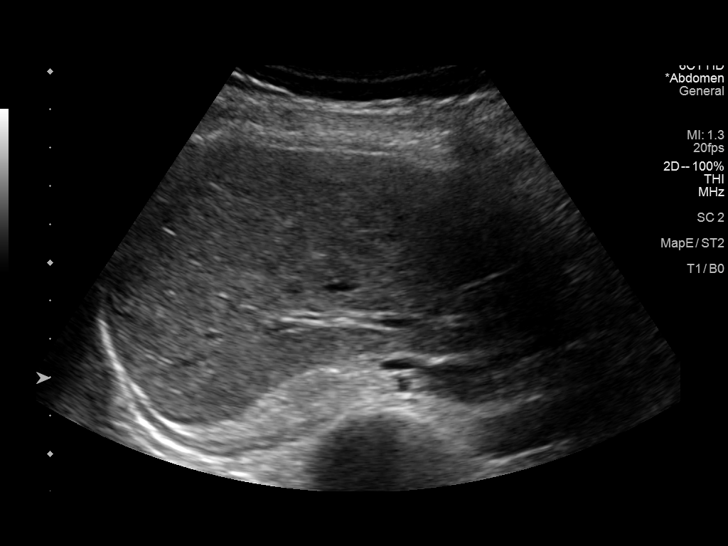
[im 25/75]
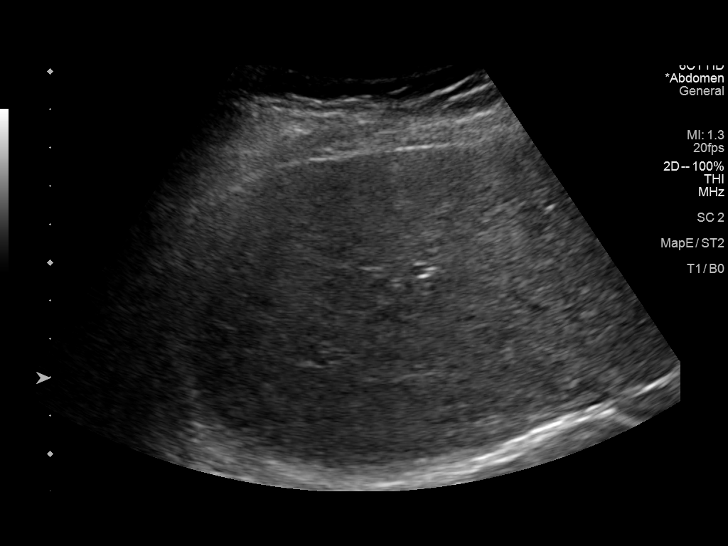
[im 28/75]
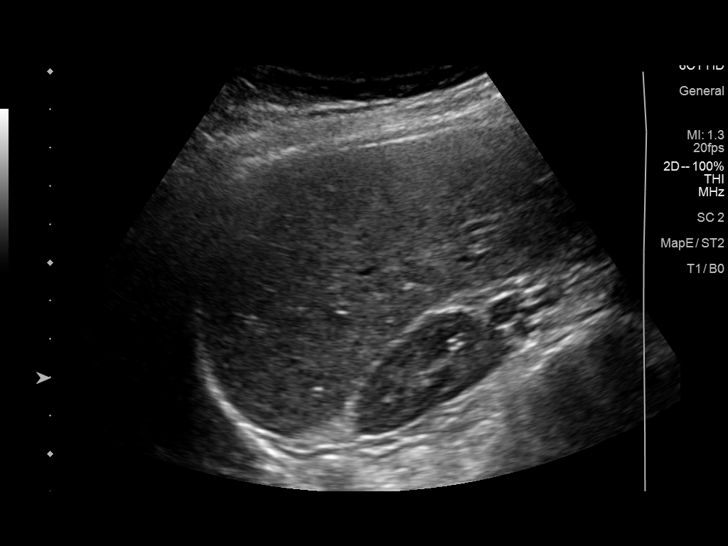
[im 34/75]
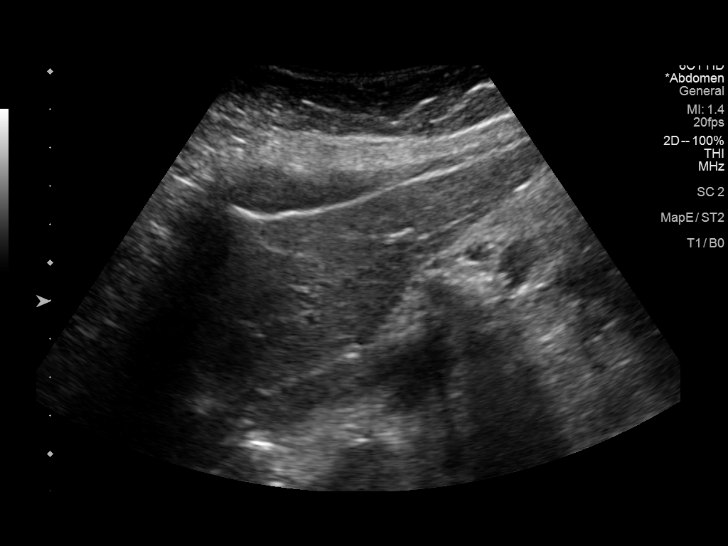
[im 41/75]
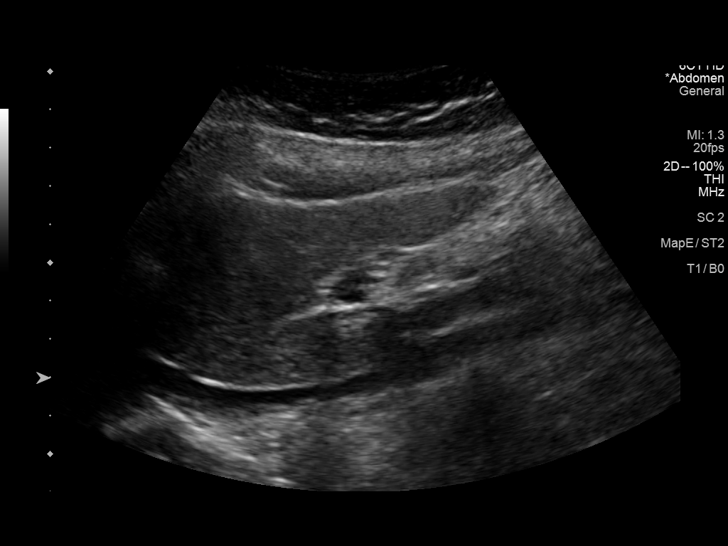
[im 47/75]
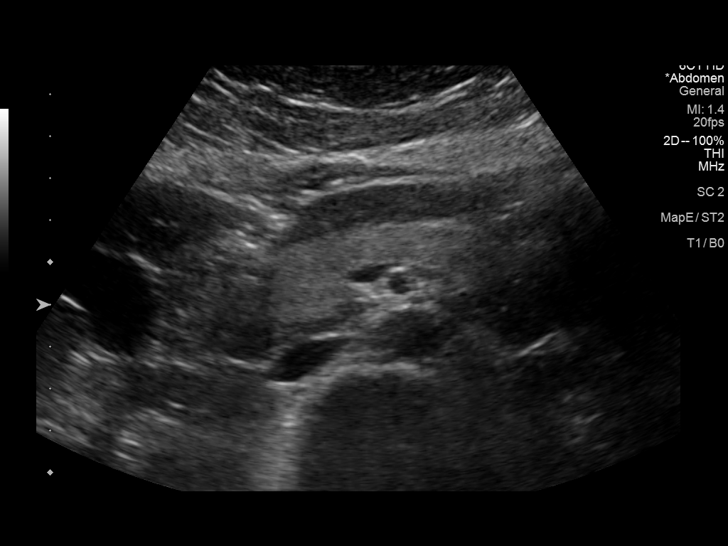
[im 50/75]
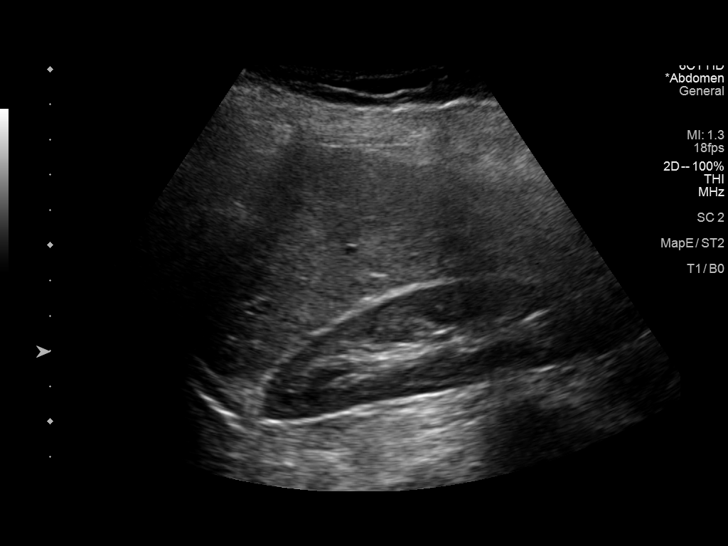
[im 56/75]
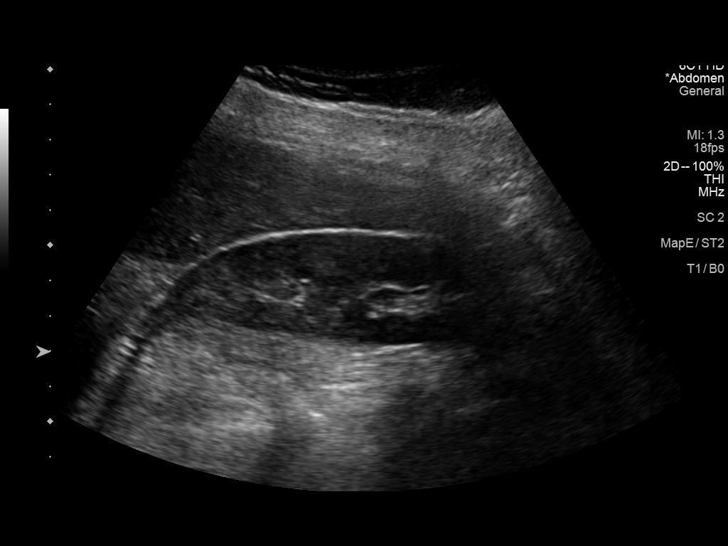
[im 62/75]
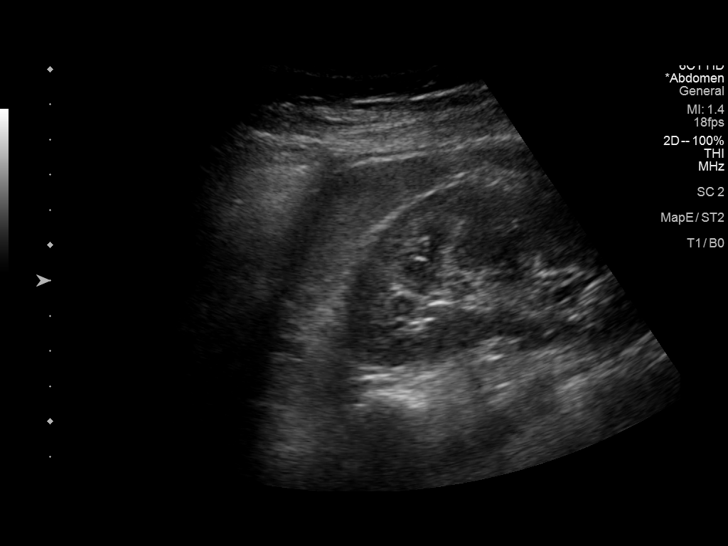
[im 68/75]
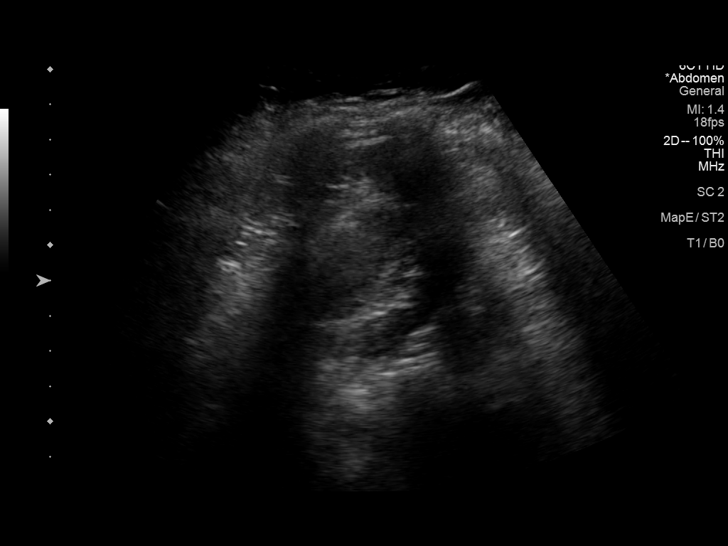
[im 75/75]
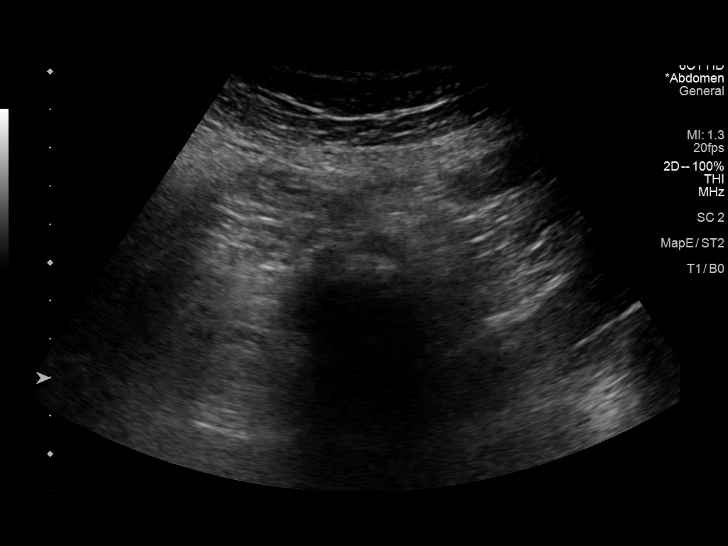

[14 of 25 positions shown; findings below may reference images not displayed]

FINDINGS: Gallbladder: No gallstones or wall thickening visualized. No
sonographic Murphy sign noted by sonographer.

Common bile duct: Diameter: 3 mm

Liver: Mild heterogeneity is noted without focal mass. Portal vein
is patent on color Doppler imaging with normal direction of blood
flow towards the liver.

IVC: No abnormality visualized.

Pancreas: Visualized portion unremarkable.

Spleen: Size and appearance within normal limits.

Right Kidney: Length: 11.6 cm. Echogenicity within normal limits.
No mass or hydronephrosis visualized.

Left Kidney: Length: 10.0 cm. Echogenicity within normal limits. No
mass or hydronephrosis visualized.

Abdominal aorta: No aneurysm visualized.

Other findings: None.
IMPRESSION: Mild heterogeneity of the liver which is of uncertain significance.
This may be related to mild fatty infiltration.

No other focal abnormality is noted.

## 2021-07-09 ENCOUNTER — Encounter: Payer: Self-pay | Admitting: Physician Assistant

## 2021-08-02 ENCOUNTER — Telehealth: Payer: 59 | Admitting: Nurse Practitioner

## 2021-08-02 DIAGNOSIS — R6889 Other general symptoms and signs: Secondary | ICD-10-CM | POA: Diagnosis not present

## 2021-08-02 DIAGNOSIS — K760 Fatty (change of) liver, not elsewhere classified: Secondary | ICD-10-CM | POA: Insufficient documentation

## 2021-08-02 MED ORDER — PSEUDOEPH-BROMPHEN-DM 30-2-10 MG/5ML PO SYRP: 5.0000 mL | ORAL_SOLUTION | Freq: Four times a day (QID) | ORAL | 0 refills | Status: DC | PRN

## 2021-08-02 MED ORDER — OSELTAMIVIR PHOSPHATE 75 MG PO CAPS: 75.0000 mg | ORAL_CAPSULE | Freq: Two times a day (BID) | ORAL | 0 refills | Status: AC

## 2021-08-02 NOTE — Progress Notes (Signed)
Virtual Visit Consent   Jenny Lane, you are scheduled for a virtual visit with a [facility] provider today.     Just as with appointments in the office, your consent must be obtained to participate.  Your consent will be active for this visit and any virtual visit you may have with one of our providers in the next 365 days.     If you have a MyChart account, a copy of this consent can be sent to you electronically.  All virtual visits are billed to your insurance company just like a traditional visit in the office.    As this is a virtual visit, video technology does not allow for your provider to perform a traditional examination.  This may limit your provider's ability to fully assess your condition.  If your provider identifies any concerns that need to be evaluated in person or the need to arrange testing (such as labs, EKG, etc.), we will make arrangements to do so.     Although advances in technology are sophisticated, we cannot ensure that it will always work on either your end or our end.  If the connection with a video visit is poor, the visit may have to be switched to a telephone visit.  With either a video or telephone visit, we are not always able to ensure that we have a secure connection.     I need to obtain your verbal consent now.   Are you willing to proceed with your visit today?    Jenny Lane has provided verbal consent on 08/02/2021 for a virtual visit (video or telephone).   Jenny Rigg, Jenny Lane   Date: 08/02/2021 11:12 AM   Virtual Visit via Video Note   I, Jenny Lane, connected with  Jenny Lane  ([id number], 09/06/1986) on 08/02/21 at 11:00 AM EST by a video-enabled telemedicine application and verified that I am speaking with the correct person using two identifiers.  Location: Patient: Virtual Visit Location Patient: Home Provider: Virtual Visit Location Provider: Home Office   I discussed the limitations of evaluation and management by telemedicine  and the availability of in person appointments. The patient expressed understanding and agreed to proceed.    History of Present Illness: Jenny Lane is a 35 y.o. who identifies as a female who was assigned female at birth, and is being seen today for FLU LIKE SYMPTOMS.  HPI:  Fever 104 this morning. Currently 100. Feeling bad for the past 24 hours.  Associated symptoms include: productive cough with yellow mucous, rhinorrhea, sore (scratchy) throat, fatigue and rhinorrhea. Using  Nyquil and mucinex which have not been effective.    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  Last taken 2 months ago for a sinus infection.    Clindamycin/Lincomycin Hives    Patient experienced low back pain, hives, and a dark line around her bottom lip when taking medication.    Sulfa Antibiotics Hives and Swelling    Lips Swell.  Last taken during childhood.    Tetracyclines & Related     Childhood reaction   Medications:  Current Outpatient Medications:    brompheniramine-pseudoephedrine-DM 30-2-10 MG/5ML syrup, Take 5 mLs by mouth 4 (four) times daily as needed., Disp: 240 mL, Rfl: 0   oseltamivir (TAMIFLU) 75  MG capsule, Take 1 capsule (75 mg total) by mouth 2 (two) times daily for 5 days., Disp: 10 capsule, Rfl: 0   budesonide (PULMICORT) 180 MCG/ACT inhaler, Inhale 2 puffs into the lungs in the morning and at bedtime., Disp: 1 each, Rfl: 0   Cholecalciferol 25 MCG (1000 UT) tablet, Take by mouth., Disp: , Rfl:    Cyanocobalamin (B-12) 500 MCG TABS, Take 500 mcg by mouth daily., Disp: , Rfl:    Multiple Vitamins-Minerals (OPTIVITE P.M.T.) TABS, , Disp: , Rfl:   Observations/Objective: Patient is well-developed, well-nourished in no acute distress.  Resting comfortably at home.   Head is normocephalic, atraumatic.  No labored breathing.  Speech is clear and coherent with logical content.  Patient is alert and oriented at baseline.    Assessment and Plan: 1. Flu-like symptoms - oseltamivir (TAMIFLU) 75 MG capsule; Take 1 capsule (75 mg total) by mouth 2 (two) times daily for 5 days.  Dispense: 10 capsule; Refill: 0 - brompheniramine-pseudoephedrine-DM 30-2-10 MG/5ML syrup; Take 5 mLs by mouth 4 (four) times daily as needed.  Dispense: 240 mL; Refill: 0   Follow Up Instructions: I discussed the assessment and treatment plan with the patient. The patient was provided an opportunity to ask questions and all were answered. The patient agreed with the plan and demonstrated an understanding of the instructions.  A copy of instructions were sent to the patient via MyChart unless otherwise noted below.     The patient was advised to call back or seek an in-person evaluation if the symptoms worsen or if the condition fails to improve as anticipated.  Time:  I spent 10 minutes with the patient via telehealth technology discussing the above problems/concerns.    Jenny Rigg, Jenny Lane

## 2021-08-02 NOTE — Patient Instructions (Addendum)
  Jenny Lane, thank you for joining Claiborne Rigg, NP for today's virtual visit.  While this provider is not your primary care provider (PCP), if your PCP is located in our provider database this encounter information will be shared with them immediately following your visit.  Consent: (Patient) Jenny Lane provided verbal consent for this virtual visit at the beginning of the encounter. BASED ON YOUR SYMPTOMS TODAY I AM TREATING YOU FOR FLU LIKE SYMPTOMS AT THIS TIME.   Current Medications:  Current Outpatient Medications:    brompheniramine-pseudoephedrine-DM 30-2-10 MG/5ML syrup, Take 5 mLs by mouth 4 (four) times daily as needed., Disp: 240 mL, Rfl: 0   oseltamivir (TAMIFLU) 75 MG capsule, Take 1 capsule (75 mg total) by mouth 2 (two) times daily for 5 days., Disp: 10 capsule, Rfl: 0   budesonide (PULMICORT) 180 MCG/ACT inhaler, Inhale 2 puffs into the lungs in the morning and at bedtime., Disp: 1 each, Rfl: 0   Cholecalciferol 25 MCG (1000 UT) tablet, Take by mouth., Disp: , Rfl:    Cyanocobalamin (B-12) 500 MCG TABS, Take 500 mcg by mouth daily., Disp: , Rfl:    Multiple Vitamins-Minerals (OPTIVITE P.M.T.) TABS, , Disp: , Rfl:    Medications ordered in this encounter:  Meds ordered this encounter  Medications   oseltamivir (TAMIFLU) 75 MG capsule    Sig: Take 1 capsule (75 mg total) by mouth 2 (two) times daily for 5 days.    Dispense:  10 capsule    Refill:  0    Order Specific Question:   Supervising Provider    Answer:   Hyacinth Meeker, BRIAN [[id number]]   brompheniramine-pseudoephedrine-DM 30-2-10 MG/5ML syrup    Sig: Take 5 mLs by mouth 4 (four) times daily as needed.    Dispense:  240 mL    Refill:  0    Order Specific Question:   Supervising Provider    Answer:   Hyacinth Meeker, BRIAN [[id number]]     *If you need refills on other medications prior to your next appointment, please contact your pharmacy*  Follow-Up: Call back or seek an in-person evaluation if the symptoms worsen or if the  condition fails to improve as anticipated.  Other Instructions INSTRUCTIONS: use a humidifier for nasal congestion Drink plenty of fluids, rest and wash hands frequently to avoid the spread of infection Alternate tylenol and Motrin for relief of fever    If you have been instructed to have an in-person evaluation today at a local Urgent Care facility, please use the link below. It will take you to a list of all of our available [facility], including address, phone number and hours of operation. Please do not delay care.  [facility]  If you or a family member do not have a primary care provider, use the link below to schedule a visit and establish care. When you choose a [facility] primary care physician or advanced practice provider, you gain a long-term partner in health. Find a Primary Care Provider  Learn more about [facility]'s in-office and virtual care options: [facility] - Get Care Now

## 2021-10-16 ENCOUNTER — Telehealth: Payer: 59 | Admitting: Physician Assistant

## 2021-10-16 DIAGNOSIS — J069 Acute upper respiratory infection, unspecified: Secondary | ICD-10-CM | POA: Diagnosis not present

## 2021-10-16 MED ORDER — BENZONATATE 100 MG PO CAPS: 100.0000 mg | ORAL_CAPSULE | Freq: Three times a day (TID) | ORAL | 0 refills | Status: DC | PRN

## 2021-10-16 MED ORDER — PSEUDOEPH-BROMPHEN-DM 30-2-10 MG/5ML PO SYRP: 5.0000 mL | ORAL_SOLUTION | Freq: Four times a day (QID) | ORAL | 0 refills | Status: DC | PRN

## 2021-10-16 MED ORDER — IPRATROPIUM BROMIDE 0.03 % NA SOLN: 2.0000 | Freq: Two times a day (BID) | NASAL | 0 refills | Status: DC

## 2021-10-16 MED ORDER — LIDOCAINE VISCOUS HCL 2 % MT SOLN: OROMUCOSAL | 0 refills | Status: DC

## 2021-10-16 NOTE — Patient Instructions (Signed)
Jenny Lane, thank you for joining [name], PA-C for today's virtual visit.  While this provider is not your primary care provider (PCP), if your PCP is located in our provider database this encounter information will be shared with them immediately following your visit.  Consent: (Patient) Jenny Lane provided verbal consent for this virtual visit at the beginning of the encounter.  Current Medications:  Current Outpatient Medications:    benzonatate (TESSALON) 100 MG capsule, Take 1 capsule (100 mg total) by mouth 3 (three) times daily as needed., Disp: 30 capsule, Rfl: 0   brompheniramine-pseudoephedrine-DM 30-2-10 MG/5ML syrup, Take 5 mLs by mouth 4 (four) times daily as needed., Disp: 120 mL, Rfl: 0   ipratropium (ATROVENT) 0.03 % nasal spray, Place 2 sprays into both nostrils every 12 (twelve) hours., Disp: 30 mL, Rfl: 0   lidocaine (XYLOCAINE) 2 % solution, 5 mL swish and swallow every 4 hours as needed for sore throat, Disp: 100 mL, Rfl: 0   budesonide (PULMICORT) 180 MCG/ACT inhaler, Inhale 2 puffs into the lungs in the morning and at bedtime., Disp: 1 each, Rfl: 0   Cholecalciferol 25 MCG (1000 UT) tablet, Take by mouth., Disp: , Rfl:    Cyanocobalamin (B-12) 500 MCG TABS, Take 500 mcg by mouth daily., Disp: , Rfl:    Multiple Vitamins-Minerals (OPTIVITE P.M.T.) TABS, , Disp: , Rfl:    Medications ordered in this encounter:  Meds ordered this encounter  Medications   lidocaine (XYLOCAINE) 2 % solution    Sig: 5 mL swish and swallow every 4 hours as needed for sore throat    Dispense:  100 mL    Refill:  0    Order Specific Question:   Supervising Provider    Answer:   Hyacinth Meeker, BRIAN [[id number]]   brompheniramine-pseudoephedrine-DM 30-2-10 MG/5ML syrup    Sig: Take 5 mLs by mouth 4 (four) times daily as needed.    Dispense:  120 mL    Refill:  0    Order Specific Question:   Supervising Provider    Answer:   MILLER, BRIAN [[id number]]   benzonatate (TESSALON) 100 MG  capsule    Sig: Take 1 capsule (100 mg total) by mouth 3 (three) times daily as needed.    Dispense:  30 capsule    Refill:  0    Order Specific Question:   Supervising Provider    Answer:   MILLER, BRIAN [[id number]]   ipratropium (ATROVENT) 0.03 % nasal spray    Sig: Place 2 sprays into both nostrils every 12 (twelve) hours.    Dispense:  30 mL    Refill:  0    Order Specific Question:   Supervising Provider    Answer:   Hyacinth Meeker, BRIAN [[id number]]     *If you need refills on other medications prior to your next appointment, please contact your pharmacy*  Follow-Up: Call back or seek an in-person evaluation if the symptoms worsen or if the condition fails to improve as anticipated.  Other Instructions Viral Respiratory Infection A viral respiratory infection is an illness that affects parts of the body that are used for breathing. These include the lungs, nose, and throat. It is caused by a germ called a virus. Some examples of this kind of infection are: A cold. The flu (influenza). A respiratory syncytial virus (RSV) infection. What are the causes? This condition is caused by a virus. It spreads from person to person. You can get the virus if: You breathe in droplets from  someone who is sick. You come in contact with people who are sick. You touch mucus or other fluid from a person who is sick. What are the signs or symptoms? Symptoms of this condition include: A stuffy or runny nose. A sore throat. A cough. Shortness of breath. Trouble breathing. Yellow or green fluid in the nose. Other symptoms may include: A fever. Sweating or chills. Tiredness (fatigue). Achy muscles. A headache. How is this treated? This condition may be treated with: Medicines that treat viruses. Medicines that make it easy to breathe. Medicines that are sprayed into the nose. Acetaminophen or NSAIDs, such as ibuprofen, to treat fever. Follow these instructions at home: Managing pain and  congestion Take over-the-counter and prescription medicines only as told by your doctor. If you have a sore throat, gargle with salt water. Do this 3-4 times a day or as needed. To make salt water, dissolve -1 tsp (3-6 g) of salt in 1 cup (237 mL) of warm water. Make sure that all the salt dissolves. Use nose drops made from salt water. This helps with stuffiness (congestion). It also helps soften the skin around your nose. Take 2 tsp (10 mL) of honey at bedtime to lessen coughing at night. Do not give honey to children who are younger than 67 year old. Drink enough fluid to keep your pee (urine) pale yellow. General instructions  Rest as much as possible. Do not drink alcohol. Do not smoke or use any products that contain nicotine or tobacco. If you need help quitting, ask your doctor. Keep all follow-up visits. How is this prevented?   Get a flu shot every year. Ask your doctor when you should get your flu shot. Do not let other people get your germs. If you are sick: Wash your hands with soap and water often. Wash your hands after you cough or sneeze. Wash hands for at least 20 seconds. If you cannot use soap and water, use hand sanitizer. Cover your mouth when you cough. Cover your nose and mouth when you sneeze. Do not share cups or eating utensils. Clean commonly used objects often. Clean commonly touched surfaces. Stay home from work or school. Avoid contact with people who are sick during cold and flu season. This is in fall and winter. Get help if: Your symptoms last for 10 days or longer. Your symptoms get worse over time. You have very bad pain in your face or forehead. Parts of your jaw or neck get very swollen. You have shortness of breath. Get help right away if: You feel pain or pressure in your chest. You have trouble breathing. You faint or feel like you will faint. You keep vomiting and it gets worse. You feel confused. These symptoms may be an emergency. Get  help right away. Call your local emergency services (911 in the [place] Do not wait to see if the symptoms will go away. Do not drive yourself to the hospital. Summary A viral respiratory infection is an illness that affects parts of the body that are used for breathing. Examples of this illness include a cold, the flu, and a respiratory syncytial virus (RSV) infection. The infection can cause a runny nose, cough, sore throat, and fever. Follow what your doctor tells you about taking medicines, drinking lots of fluid, washing your hands, resting at home, and avoiding people who are sick. This information is not intended to replace advice given to you by your health care provider. Make sure you discuss any questions you  have with your health care provider. Document Revised: 11/28/2020 Document Reviewed: 11/28/2020 Elsevier Patient Education  2022 [employer].    If you have been instructed to have an in-person evaluation today at a local Urgent Care facility, please use the link below. It will take you to a list of all of our available [facility], including address, phone number and hours of operation. Please do not delay care.  [facility]  If you or a family member do not have a primary care provider, use the link below to schedule a visit and establish care. When you choose a [facility] primary care physician or advanced practice provider, you gain a long-term partner in health. Find a Primary Care Provider  Learn more about [facility]'s in-office and virtual care options: [facility] - Get Care Now

## 2021-10-16 NOTE — Progress Notes (Signed)
Virtual Visit Consent   Jenny Lane, you are scheduled for a virtual visit with a [facility] provider today.     Just as with appointments in the office, your consent must be obtained to participate.  Your consent will be active for this visit and any virtual visit you may have with one of our providers in the next 365 days.     If you have a MyChart account, a copy of this consent can be sent to you electronically.  All virtual visits are billed to your insurance company just like a traditional visit in the office.    As this is a virtual visit, video technology does not allow for your provider to perform a traditional examination.  This may limit your provider's ability to fully assess your condition.  If your provider identifies any concerns that need to be evaluated in person or the need to arrange testing (such as labs, EKG, etc.), we will make arrangements to do so.     Although advances in technology are sophisticated, we cannot ensure that it will always work on either your end or our end.  If the connection with a video visit is poor, the visit may have to be switched to a telephone visit.  With either a video or telephone visit, we are not always able to ensure that we have a secure connection.     I need to obtain your verbal consent now.   Are you willing to proceed with your visit today?    Jenny Lane has provided verbal consent on 10/16/2021 for a virtual visit (video or telephone).   Mar Daring, PA-C   Date: 10/16/2021 10:20 AM   Virtual Visit via Video Note   I, Mar Daring, connected with  Jenny Lane  ([id number], 05-30-1986) on 10/16/21 at 10:15 AM EST by a video-enabled telemedicine application and verified that I am speaking with the correct person using two identifiers.  Location: Patient: Virtual Visit Location Patient: Home Provider: Virtual Visit Location Provider: Home Office   I discussed the limitations of evaluation and management by  telemedicine and the availability of in person appointments. The patient expressed understanding and agreed to proceed.    History of Present Illness: Jenny Lane is a 36 y.o. who identifies as a female who was assigned female at birth, and is being seen today for URI symptoms.  HPI: Sore Throat  This is a new problem. The current episode started in the past 7 days (3 days). The problem has been gradually worsening. There has been no fever. The pain is moderate. Associated symptoms include congestion, coughing, headaches, a hoarse voice and swollen glands. Pertinent negatives include no diarrhea, ear discharge, ear pain, shortness of breath, trouble swallowing or vomiting. She has had no exposure to strep or mono. Treatments tried: nyquil, tessalon perles. The treatment provided mild relief.    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  Last taken 2 months ago for a sinus infection.    Clindamycin/Lincomycin Hives    Patient experienced low back pain, hives, and a dark line around her bottom lip when taking medication.    Sulfa Antibiotics Hives and Swelling    Lips Swell.  Last taken during childhood.    Tetracyclines & Related  Childhood reaction   Medications:  Current Outpatient Medications:    benzonatate (TESSALON) 100 MG capsule, Take 1 capsule (100 mg total) by mouth 3 (three) times daily as needed., Disp: 30 capsule, Rfl: 0   brompheniramine-pseudoephedrine-DM 30-2-10 MG/5ML syrup, Take 5 mLs by mouth 4 (four) times daily as needed., Disp: 120 mL, Rfl: 0   ipratropium (ATROVENT) 0.03 % nasal spray, Place 2 sprays into both nostrils every 12 (twelve) hours., Disp: 30 mL, Rfl: 0   lidocaine (XYLOCAINE) 2 % solution, 5 mL swish and swallow every 4 hours as needed  for sore throat, Disp: 100 mL, Rfl: 0   budesonide (PULMICORT) 180 MCG/ACT inhaler, Inhale 2 puffs into the lungs in the morning and at bedtime., Disp: 1 each, Rfl: 0   Cholecalciferol 25 MCG (1000 UT) tablet, Take by mouth., Disp: , Rfl:    Cyanocobalamin (B-12) 500 MCG TABS, Take 500 mcg by mouth daily., Disp: , Rfl:    Multiple Vitamins-Minerals (OPTIVITE P.M.T.) TABS, , Disp: , Rfl:   Observations/Objective: Patient is well-developed, well-nourished in no acute distress.  Resting comfortably at home.  Head is normocephalic, atraumatic.  No labored breathing.  Speech is clear and coherent with logical content.  Patient is alert and oriented at baseline.    Assessment and Plan: 1. Viral URI with cough - lidocaine (XYLOCAINE) 2 % solution; 5 mL swish and swallow every 4 hours as needed for sore throat  Dispense: 100 mL; Refill: 0 - brompheniramine-pseudoephedrine-DM 30-2-10 MG/5ML syrup; Take 5 mLs by mouth 4 (four) times daily as needed.  Dispense: 120 mL; Refill: 0 - benzonatate (TESSALON) 100 MG capsule; Take 1 capsule (100 mg total) by mouth 3 (three) times daily as needed.  Dispense: 30 capsule; Refill: 0 - ipratropium (ATROVENT) 0.03 % nasal spray; Place 2 sprays into both nostrils every 12 (twelve) hours.  Dispense: 30 mL; Refill: 0  - Suspect Viral URI - Treat symptomatically - Bromfed Dm and tessalon perles for cough - Ipratropium bromide nasal spray for nasal congestion and drainage - Viscous lidocaine for the sore throat - Push fluids - Steam treatments with or without echinacea essential oil and Humidifier may help - Rest - Seek in person evaluation if not improving or if symptoms worsen  Follow Up Instructions: I discussed the assessment and treatment plan with the patient. The patient was provided an opportunity to ask questions and all were answered. The patient agreed with the plan and demonstrated an understanding of the instructions.  A copy of instructions were  sent to the patient via MyChart unless otherwise noted below.    The patient was advised to call back or seek an in-person evaluation if the symptoms worsen or if the condition fails to improve as anticipated.  Time:  I spent 11 minutes with the patient via telehealth technology discussing the above problems/concerns.    Mar Daring, PA-C

## 2021-10-24 ENCOUNTER — Encounter: Payer: Self-pay | Admitting: Physician Assistant

## 2021-10-24 ENCOUNTER — Ambulatory Visit (INDEPENDENT_AMBULATORY_CARE_PROVIDER_SITE_OTHER): Payer: 59 | Admitting: Physician Assistant

## 2021-10-24 ENCOUNTER — Other Ambulatory Visit: Payer: Self-pay

## 2021-10-24 VITALS — BP 108/70 | HR 92 | Temp 98.6°F | Ht 62.0 in | Wt 135.0 lb

## 2021-10-24 DIAGNOSIS — R051 Acute cough: Secondary | ICD-10-CM

## 2021-10-24 MED ORDER — POLYMYXIN B-TRIMETHOPRIM 10000-0.1 UNIT/ML-% OP SOLN: 1.0000 [drp] | OPHTHALMIC | 0 refills | Status: DC

## 2021-10-24 MED ORDER — AMOXICILLIN 875 MG PO TABS: 875.0000 mg | ORAL_TABLET | Freq: Two times a day (BID) | ORAL | 0 refills | Status: AC

## 2021-10-24 NOTE — Progress Notes (Signed)
Jenny Lane is a 36 y.o. female here for eye irritation.  History of Present Illness:   Chief Complaint  Patient presents with   eye irritation    Right eye is itching and watering, started this morning.    HPI  Eye Irritation Jenny Lane presents with c/o right eye irritation that has been onset this morning. States upon waking up she noticed crust around her eye as well as itchiness and excessive tearing. Additionally she has felt her ears popping all day, but reports this to not be painful. Upon further discussion, Jenny Lane reports she was seen by Fenton Malling, PA on 10/16/21 via tele-health due to recent illness. During that time she was experiencing sore throat, nasal congestion, and productive cough with green sputum. Following this visit she was prescribed Atrovent nasal spray, 2 sprays in both nostrils every 12 hours, Xylocaine 2% solution, 5 mL every 4 hours, and tessalon perles 100 mg TID as needed.   Currently pt states this regimen was beneficial for her and she had an improvement of her sx prior to onset of current sx. In an effort to manage her current sx she has taken otc cold medication but this has provided no relief. Denies changes in vision, fever, or chills.   Children are at home and sick.  Past Medical History:  Diagnosis Date   Depression    History of chicken pox    Tuberculosis    Received 9 Months of Treatement in 2006   Typhoid fever    Varicella    As a child      Social History   Tobacco Use   Smoking status: Never   Smokeless tobacco: Never  Vaping Use   Vaping Use: Never used  Substance Use Topics   Alcohol use: No   Drug use: No    Past Surgical History:  Procedure Laterality Date   VAGINAL DELIVERY      Family History  Problem Relation Age of Onset   Diabetes Father     Allergies  Allergen Reactions   Biaxin [Clarithromycin] Anaphylaxis   Cefdinir Hives    Sneezing, swollen lips, and hives.  Last taken 2 months ago for a sinus  infection.    Clindamycin/Lincomycin Hives    Patient experienced low back pain, hives, and a dark line around her bottom lip when taking medication.    Sulfa Antibiotics Hives and Swelling    Lips Swell.  Last taken during childhood.    Tetracyclines & Related     Childhood reaction    Current Medications:   Current Outpatient Medications:    benzonatate (TESSALON) 100 MG capsule, Take 1 capsule (100 mg total) by mouth 3 (three) times daily as needed., Disp: 30 capsule, Rfl: 0   brompheniramine-pseudoephedrine-DM 30-2-10 MG/5ML syrup, Take 5 mLs by mouth 4 (four) times daily as needed., Disp: 120 mL, Rfl: 0   ipratropium (ATROVENT) 0.03 % nasal spray, Place 2 sprays into both nostrils every 12 (twelve) hours., Disp: 30 mL, Rfl: 0   loratadine (ALLERGY RELIEF) 10 MG tablet, Take 10 mg by mouth daily., Disp: , Rfl:    Review of Systems:   ROS  Vitals:   Vitals:   10/24/21 1537  BP: 108/70  Pulse: 92  Temp: 98.6 F (37 C)  TempSrc: Temporal  SpO2: 97%  Weight: 135 lb (61.2 kg)  Height: 5\' 2"  (1.575 m)     Body mass index is 24.69 kg/m.  Physical Exam:   Physical Exam Vitals and nursing  note reviewed.  Constitutional:      General: She is not in acute distress.    Appearance: She is well-developed. She is not ill-appearing or toxic-appearing.  HENT:     Head: Normocephalic and atraumatic.     Right Ear: Ear canal and external ear normal. A middle ear effusion is present. Tympanic membrane is not erythematous, retracted or bulging.     Left Ear: Tympanic membrane, ear canal and external ear normal. Tympanic membrane is not erythematous, retracted or bulging.     Nose: Nose normal.     Right Sinus: No maxillary sinus tenderness or frontal sinus tenderness.     Left Sinus: No maxillary sinus tenderness or frontal sinus tenderness.     Mouth/Throat:     Pharynx: Uvula midline. Posterior oropharyngeal erythema present.     Tonsils: 2+ on the right. 2+ on the left.  Eyes:      General: Lids are normal.     Conjunctiva/sclera:     Right eye: Right conjunctiva is injected.  Neck:     Trachea: Trachea normal.  Cardiovascular:     Rate and Rhythm: Normal rate and regular rhythm.     Pulses: Normal pulses.     Heart sounds: Normal heart sounds, S1 normal and S2 normal.  Pulmonary:     Effort: Pulmonary effort is normal.     Breath sounds: Normal breath sounds. No decreased breath sounds, wheezing, rhonchi or rales.  Lymphadenopathy:     Cervical: No cervical adenopathy.  Skin:    General: Skin is warm and dry.  Neurological:     Mental Status: She is alert.     GCS: GCS eye subscore is 4. GCS verbal subscore is 5. GCS motor subscore is 6.  Psychiatric:        Speech: Speech normal.        Behavior: Behavior normal. Behavior is cooperative.    Assessment and Plan:   Cough No red flags Suspect acute otitis media and conjunctivitis Start amoxicillin 875 mg twice daily (she states that she can tolerate this medication despite cephalosporin allergy) and Polytrim ophthalmic soultion, 1 drop in both eyes, four times daily  Informed patient that if there any concerns for an allergic reaction, to stop medication and reach out to office Follow up if new/worsening or lack of improvement of symptoms occurs  I,Jenny Lane,acting as a scribe for [employer], PA.,have documented all relevant documentation on the behalf of Inda Coke, PA,as directed by  Inda Coke, PA while in the presence of Inda Coke, [place].  I, Inda Coke, [place], have reviewed all documentation for this visit. The documentation on 10/24/21 for the exam, diagnosis, procedures, and orders are all accurate and complete.   Inda Coke, PA-C

## 2021-10-24 NOTE — Patient Instructions (Signed)
It was great to see you!  Start oral amoxicillin -- if any concerns for allergic reaction, please stop this medication  Start eye drops  If lack of improvement or any worsening, please let me know.  Take care,  Jarold Motto PA-C

## 2021-10-26 ENCOUNTER — Encounter: Payer: Self-pay | Admitting: Physician Assistant

## 2021-10-29 ENCOUNTER — Other Ambulatory Visit: Payer: Self-pay

## 2021-10-29 ENCOUNTER — Encounter: Payer: Self-pay | Admitting: Family Medicine

## 2021-10-29 ENCOUNTER — Ambulatory Visit: Payer: 59 | Admitting: Family Medicine

## 2021-10-29 ENCOUNTER — Ambulatory Visit (INDEPENDENT_AMBULATORY_CARE_PROVIDER_SITE_OTHER): Payer: 59 | Admitting: Family Medicine

## 2021-10-29 VITALS — BP 110/74 | HR 100 | Temp 98.5°F | Ht 62.0 in | Wt 128.5 lb

## 2021-10-29 DIAGNOSIS — J069 Acute upper respiratory infection, unspecified: Secondary | ICD-10-CM

## 2021-10-29 DIAGNOSIS — B309 Viral conjunctivitis, unspecified: Secondary | ICD-10-CM | POA: Diagnosis not present

## 2021-10-29 LAB — POC COVID19 BINAXNOW: SARS Coronavirus 2 Ag: NEGATIVE

## 2021-10-29 NOTE — Progress Notes (Signed)
Subjective:     Patient ID: Jenny Lane, female    DOB: Aug 16, 1986, 36 y.o.   MRN: [id number]  Chief Complaint  Patient presents with   Eye Problem    Right eye watery and some redness started 5 days ago    Fever    Comes and goes Took Advil today   Sore Throat   Sinus drainage    HPI-on amox 5 days Chief complaint: R eye draninage.  Fever, sore throat, sinus draning.  Mild ocugh.  No sob. Diarrhea from amox Symptom onset: 5 days.  Pertinent positives: fever 101-102 past 5 days, didn't test covid. Tempo 99 this am.  Eye-R-red-taking polymixin.  L eye red today, but better now.  Was itchy Pertinent negatives: no vomit Treatments tried: amox, polymixin, cough syrup Vaccine status: no flu shot.  2 covids Sick exposure: son and dau neg for covid   Health Maintenance Due  Topic Date Due   Hepatitis C Screening  Never done   PAP SMEAR-Modifier  Never done   COVID-19 Vaccine (3 - Booster for Pfizer series) 02/19/2020   INFLUENZA VACCINE  04/07/2021    Past Medical History:  Diagnosis Date   Depression    History of chicken pox    Tuberculosis    Received 9 Months of Treatement in 2006   Typhoid fever    Varicella    As a child     Past Surgical History:  Procedure Laterality Date   VAGINAL DELIVERY      Outpatient Medications Prior to Visit  Medication Sig Dispense Refill   amoxicillin (AMOXIL) 875 MG tablet Take 1 tablet (875 mg total) by mouth 2 (two) times daily for 10 days. 20 tablet 0   brompheniramine-pseudoephedrine-DM 30-2-10 MG/5ML syrup Take 5 mLs by mouth 4 (four) times daily as needed. 120 mL 0   trimethoprim-polymyxin b (POLYTRIM) ophthalmic solution Place 1 drop into the right eye every 4 (four) hours. 10 mL 0   benzonatate (TESSALON) 100 MG capsule Take 1 capsule (100 mg total) by mouth 3 (three) times daily as needed. (Patient not taking: Reported on 10/29/2021) 30 capsule 0   ipratropium (ATROVENT) 0.03 % nasal spray Place 2 sprays into both nostrils  every 12 (twelve) hours. (Patient not taking: Reported on 10/29/2021) 30 mL 0   loratadine (CLARITIN) 10 MG tablet Take 10 mg by mouth daily. (Patient not taking: Reported on 10/29/2021)     No facility-administered medications prior to visit.    Allergies  Allergen Reactions   Biaxin [Clarithromycin] Anaphylaxis   Cefdinir Hives    Sneezing, swollen lips, and hives.  Last taken 2 months ago for a sinus infection.    Clindamycin/Lincomycin Hives    Patient experienced low back pain, hives, and a dark line around her bottom lip when taking medication.    Sulfa Antibiotics Hives and Swelling    Lips Swell.  Last taken during childhood.    Tetracyclines & Related     Childhood reaction   ROS neg/noncontributory except as noted HPI/below      Objective:     BP 110/74    Pulse 100    Temp 98.5 F (36.9 C) (Temporal)    Ht 5\' 2"  (1.575 m)    Wt 128 lb 8 oz (58.3 kg)    LMP 10/24/2021 (Exact Date)    SpO2 97%    BMI 23.50 kg/m  Wt Readings from Last 3 Encounters:  10/29/21 128 lb 8 oz (58.3 kg)  10/24/21  135 lb (61.2 kg)  12/10/20 150 lb (68 kg)        Gen: WDWN NAD mildly ill HEENT: NCAT, conjunctiva sl injected R>L, sclera nonicteric TM WNL B, OP moist, no exudates but red. Tongue white coating, but not elsewhere NECK:  supple, no thyromegaly, no nodes, no carotid bruits CARDIAC: RRR, S1S2+, no murmur. DP 2+B LUNGS: CTAB. No wheezes ABDOMEN:  BS+, soft, NTND, No HSM, no masses EXT:  no edema MSK: no gross abnormalities.  NEURO: A&O x3.  CN II-XII intact.  PSYCH: normal mood. Good eye contact  Results for orders placed or performed in visit on 10/29/21  POC COVID-19  Result Value Ref Range   SARS Coronavirus 2 Ag Negative Negative       Assessment & Plan:   Problem List Items Addressed This Visit   None Visit Diagnoses     Viral URI with cough    -  Primary   Acute viral conjunctivitis of both eyes          URI-children sick as well.  Covid neg here.   Diarrhea w/amox and not helping after 5 days.  Stop.  Will not do other abx as I feel viral and pt w/MULT drug allergies.  Off work 2 more days.  Symptomatic tx.  Worse, new symptoms, let us know Conjuctivitis-cont polymixin  No orders of the defined types were placed in this encounter.   Angelena Sole, MD

## 2021-10-29 NOTE — Patient Instructions (Signed)
°  You can take tylenol for pain/fevers or ibuprofen If worsening symptoms, let us know or go to the Emergency room   Stop amoxicillin Garggles for throat.  Honey/lemon.

## 2021-10-29 NOTE — Telephone Encounter (Signed)
Called pt and scheduled for today with Dr Ruthine Dose. Pt called back and decided she did not want to be seen. Said she no longer had fever and eye was better.

## 2021-10-30 NOTE — Telephone Encounter (Signed)
Please advise 

## 2021-11-11 ENCOUNTER — Other Ambulatory Visit: Payer: Self-pay | Admitting: Physician Assistant

## 2021-11-11 DIAGNOSIS — J069 Acute upper respiratory infection, unspecified: Secondary | ICD-10-CM

## 2021-12-04 ENCOUNTER — Telehealth: Payer: 59 | Admitting: Physician Assistant

## 2021-12-04 DIAGNOSIS — J069 Acute upper respiratory infection, unspecified: Secondary | ICD-10-CM | POA: Diagnosis not present

## 2021-12-04 MED ORDER — PSEUDOEPH-BROMPHEN-DM 30-2-10 MG/5ML PO SYRP: 5.0000 mL | ORAL_SOLUTION | Freq: Four times a day (QID) | ORAL | 0 refills | Status: DC | PRN

## 2021-12-04 MED ORDER — FLUTICASONE PROPIONATE 50 MCG/ACT NA SUSP
2.0000 | Freq: Every day | NASAL | 0 refills | Status: DC
Start: 2021-12-04 — End: 2021-12-24

## 2021-12-04 NOTE — Patient Instructions (Signed)
?  [name], thank you for joining [name], PA-C for today's virtual visit.  While this provider is not your primary care provider (PCP), if your PCP is located in our provider database this encounter information will be shared with them immediately following your visit. ? ?Consent: ?(Patient) Aalaysia Liggins provided verbal consent for this virtual visit at the beginning of the encounter. ? ?Current Medications: ? ?Current Outpatient Medications:  ?  brompheniramine-pseudoephedrine-DM 30-2-10 MG/5ML syrup, Take 5 mLs by mouth 4 (four) times daily as needed., Disp: 120 mL, Rfl: 0  ? ?Medications ordered in this encounter:  ?No orders of the defined types were placed in this encounter. ?  ? ?*If you need refills on other medications prior to your next appointment, please contact your pharmacy* ? ?Follow-Up: ?Call back or seek an in-person evaluation if the symptoms worsen or if the condition fails to improve as anticipated. ? ?Other Instructions ?Please stay well-hydrated and get plenty of rest. ?Start Vitamin D3 1000 units daily, Vitamin C 1000 mg daily and a zinc supplement.  ?Ok to continue your OTC medications and saline nasal rinses. ?Start the Flonase and Bromfed-DM syrup as prescribed. ? ?Follow-up if symptoms are not resolving.  ? ? ?If you have been instructed to have an in-person evaluation today at a local Urgent Care facility, please use the link below. It will take you to a list of all of our available [facility], including address, phone number and hours of operation. Please do not delay care.  ?[facility] ? ?If you or a family member do not have a primary care provider, use the link below to schedule a visit and establish care. When you choose a [facility] primary care physician or advanced practice provider, you gain a long-term partner in health. ?Find a Primary Care Provider ? ?Learn more about [facility]'s in-office and virtual care options: ?[facility] -  Get Care Now  ?

## 2021-12-04 NOTE — Progress Notes (Signed)
?Virtual Visit Consent  ? ?Jenny Lane, you are scheduled for a virtual visit with a [facility] provider today.   ?  ?Just as with appointments in the office, your consent must be obtained to participate.  Your consent will be active for this visit and any virtual visit you may have with one of our providers in the next 365 days.   ?  ?If you have a MyChart account, a copy of this consent can be sent to you electronically.  All virtual visits are billed to your insurance company just like a traditional visit in the office.   ? ?As this is a virtual visit, video technology does not allow for your provider to perform a traditional examination.  This may limit your provider's ability to fully assess your condition.  If your provider identifies any concerns that need to be evaluated in person or the need to arrange testing (such as labs, EKG, etc.), we will make arrangements to do so.   ?  ?Although advances in technology are sophisticated, we cannot ensure that it will always work on either your end or our end.  If the connection with a video visit is poor, the visit may have to be switched to a telephone visit.  With either a video or telephone visit, we are not always able to ensure that we have a secure connection.    ? ?I need to obtain your verbal consent now.   Are you willing to proceed with your visit today?  ?  ?Jenny Lane has provided verbal consent on 12/04/2021 for a virtual visit (video or telephone). ?  ?[name], PA-C  ? ?Date: 12/04/2021 10:52 AM ? ? ?Virtual Visit via Video Note  ? ?IPiedad Climes, connected with  Jenny Lane  ([id number], 06/08/1986) on 12/04/21 at 10:45 AM EDT by a video-enabled telemedicine application and verified that I am speaking with the correct person using two identifiers. ? ?Location: ?Patient: Virtual Visit Location Patient: Home ?Provider: Virtual Visit Location Provider: Home Office ?  ?I discussed the limitations of evaluation and management by  telemedicine and the availability of in person appointments. The patient expressed understanding and agreed to proceed.   ? ?History of Present Illness: ?Jenny Lane is a 36 y.o. who identifies as a female who was assigned female at birth, and is being seen today for URI symptoms starting yesterday with nasal congestion and sore throat along with fatigue. Notes green mucous. Denies ear pain or tooth pain. Some tender nodes in neck -- yesterday only. Denies SOB. Denies recent travel. Daughter is sick with fever and nasal congestion and cough. No COVID exposure. Has been taking Advil Cold and Sinus.  ? ? ? ?HPI: HPI  ?Problems:  ?Patient Active Problem List  ? Diagnosis Date Noted  ? 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 Tuberculosis 12/22/2019  ?  ?Allergies:  ?Allergies  ?Allergen Reactions  ? Biaxin [Clarithromycin] Anaphylaxis  ? Cefdinir Hives  ?  Sneezing, swollen lips, and hives.  Last taken 2 months ago for a sinus infection.   ? Clindamycin/Lincomycin Hives  ?  Patient experienced low back pain, hives, and a dark line around her bottom lip when taking medication.   ? Sulfa Antibiotics Hives and Swelling  ?  Lips Swell.  Last taken during childhood.   ? Tetracyclines & Related   ?  Childhood reaction  ? ?Medications:  ?Current Outpatient Medications:  ?  brompheniramine-pseudoephedrine-DM 30-2-10 MG/5ML syrup, Take 5 mLs by mouth 4 (four)  times daily as needed., Disp: 120 mL, Rfl: 0 ?  fluticasone (FLONASE) 50 MCG/ACT nasal spray, Place 2 sprays into both nostrils daily., Disp: 16 g, Rfl: 0 ? ?Observations/Objective: ?Patient is well-developed, well-nourished in no acute distress.  ?Resting comfortably at home.  ?Head is normocephalic, atraumatic.  ?No labored breathing. ?Speech is clear and coherent with logical content.  ?Patient is alert and oriented at baseline.  ? ?Assessment and Plan: ?1. Viral URI with cough ?- fluticasone (FLONASE) 50 MCG/ACT nasal spray; Place 2 sprays into both nostrils daily.  Dispense: 16  g; Refill: 0 ?- brompheniramine-pseudoephedrine-DM 30-2-10 MG/5ML syrup; Take 5 mLs by mouth 4 (four) times daily as needed.  Dispense: 120 mL; Refill: 0 ? ?Milder symptoms. Afebrile. Discussed consideration of home COVID test to be cautious. Supportive measures and OTC medications reviewed with patient. Rx Bromfed-DM syrup and Flonase. Strict follow-up precautions discussed.  ? ?Follow Up Instructions: ?I discussed the assessment and treatment plan with the patient. The patient was provided an opportunity to ask questions and all were answered. The patient agreed with the plan and demonstrated an understanding of the instructions.  A copy of instructions were sent to the patient via MyChart unless otherwise noted below.  ? ?The patient was advised to call back or seek an in-person evaluation if the symptoms worsen or if the condition fails to improve as anticipated. ? ?Time:  ?I spent 10 minutes with the patient via telehealth technology discussing the above problems/concerns.   ? ?[name], PA-C ?

## 2021-12-24 ENCOUNTER — Ambulatory Visit (INDEPENDENT_AMBULATORY_CARE_PROVIDER_SITE_OTHER): Payer: 59 | Admitting: Physician Assistant

## 2021-12-24 VITALS — BP 98/68 | HR 73 | Temp 98.7°F | Ht 62.0 in | Wt 130.8 lb

## 2021-12-24 DIAGNOSIS — R1011 Right upper quadrant pain: Secondary | ICD-10-CM | POA: Diagnosis not present

## 2021-12-24 DIAGNOSIS — L503 Dermatographic urticaria: Secondary | ICD-10-CM

## 2021-12-24 DIAGNOSIS — F320 Major depressive disorder, single episode, mild: Secondary | ICD-10-CM | POA: Diagnosis not present

## 2021-12-24 NOTE — Progress Notes (Signed)
Jenny Lane is a 36 y.o. female here for a follow up referred by former PCP, Dr. Artis Flock ? ?History of Present Illness:  ? ?Chief Complaint  ?Patient presents with  ? transfer care  ?  Former [facility] pt; pt c/o stomach rumbling and no pain but is bulging per pt. Pt denies possibility of being pregnant, pt states Caplyta is causing her to go to the bathroom 3 times daily and stomach started making sounds since taking it  ? ? ?HPI ? ?Abdominal Bulging ?Jenny Lane presents with c/o abdominal bulging that has been present for 3 years following the birth of her last child. States that she will feel occasional pain but nothing that is intolerable. This was previously discussed with me on 12/12/19 in which she stated she was experiencing intermittent RUQ pain x 3-4 months as well as abdominal bloating. At the time she explained she had previously trialed OTC nexium for similar sx which helped but upon restarting the medication her sx at that time did not improve. Although I had offered further evaluation via [facility] but she declined this and thought it would be better to follow up with her PCP at the time, Dr. Artis Flock.  ? ?Upon following up with Dr. Artis Flock on 12/22/19, pt was recommended to undergo an abdomen US for further evaluation. Results of the US showed mild heterogeneity of the liver which is of uncertain significance. This may have been related to fatty liver. Although this was found to be the case and not further evaluated, Jenny Lane is interested in re-checking her labs to be sure nothing significant has changed.  She states that she also had a CT scan of her abdomen in [place] in summer 2021 -- and that this was unremarkable. ? ? ?Depression  ?As of 12/17/21, Jenny Lane began taking Caplyta 21 mg daily as prescribed by her provider at [facility]. States that since taking the medication she has been experiencing increased fatigue, frequent regular BMs, and more pronounced bowel sounds. Although she has reached out to her provider  regarding these side effects, she was told to adjust the time she took the medication rather than stopping it. Since doing this she has found this to slightly improve her sx, but she still wanted to make sure this wasn't something to be concerned about. In terms of her mood, she hasn't noticed much of a difference especially since she is still experiencing delusions about people being around her kids, but she is open to taking the medication for at least 2 weeks. Denies SI/HI.  ? ? ?Rash ?In addition to her other concerns, pt states she has been dealing with a sporadic rash that appears on random parts of her body. These small rashes are itchy at times but not unbearable. She has never tried anything on them for fear of making them worse, but would like to trial something at this time.  ? ?Past Medical History:  ?Diagnosis Date  ? Depression   ? History of chicken pox   ? Tuberculosis   ? Received 9 Months of Treatement in 2006  ? Typhoid fever   ? Varicella   ? As a child   ? ?  ?Social History  ? ?Tobacco Use  ? Smoking status: Never  ? Smokeless tobacco: Never  ?Vaping Use  ? Vaping Use: Never used  ?Substance Use Topics  ? Alcohol use: No  ? Drug use: No  ? ? ?Past Surgical History:  ?Procedure Laterality Date  ? VAGINAL DELIVERY    ? ? ?  Family History  ?Problem Relation Age of Onset  ? Diabetes Father   ? ? ?Allergies  ?Allergen Reactions  ? Biaxin [Clarithromycin] Anaphylaxis  ? Cefdinir Hives  ?  Sneezing, swollen lips, and hives.  Last taken 2 months ago for a sinus infection.   ? Clindamycin/Lincomycin Hives  ?  Patient experienced low back pain, hives, and a dark line around her bottom lip when taking medication.   ? Sulfa Antibiotics Hives and Swelling  ?  Lips Swell.  Last taken during childhood.   ? Tetracyclines & Related   ?  Childhood reaction  ? ? ?Current Medications:  ? ?Current Outpatient Medications:  ?  Lumateperone Tosylate (CAPLYTA) 21 MG CAPS, , Disp: , Rfl:   ? ?Review of Systems:   ? ?ROS ?Negative unless otherwise specified per HPI. ?Vitals:  ? ?Vitals:  ? 12/24/21 1533  ?BP: 98/68  ?Pulse: 73  ?Temp: 98.7 ?F (37.1 ?C)  ?TempSrc: Temporal  ?SpO2: 99%  ?Weight: 130 lb 12.8 oz (59.3 kg)  ?Height: 5\' 2"  (1.575 m)  ?   ?Body mass index is 23.92 kg/m?. ? ?Physical Exam:  ? ?Physical Exam ?Vitals and nursing note reviewed.  ?Constitutional:   ?   General: She is not in acute distress. ?   Appearance: She is well-developed. She is not ill-appearing or toxic-appearing.  ?Cardiovascular:  ?   Rate and Rhythm: Normal rate and regular rhythm.  ?   Pulses: Normal pulses.  ?   Heart sounds: Normal heart sounds, S1 normal and S2 normal.  ?Pulmonary:  ?   Effort: Pulmonary effort is normal.  ?   Breath sounds: Normal breath sounds.  ?Abdominal:  ?   General: Abdomen is flat. Bowel sounds are normal.  ?   Palpations: Abdomen is soft.  ?   Tenderness: There is no abdominal tenderness.  ?Skin: ?   General: Skin is warm and dry.  ?   Comments: Slight erythema to r anterior forearm without warmth  ?Neurological:  ?   Mental Status: She is alert.  ?   GCS: GCS eye subscore is 4. GCS verbal subscore is 5. GCS motor subscore is 6.  ?Psychiatric:     ?   Speech: Speech normal.     ?   Behavior: Behavior normal. Behavior is cooperative.  ? ? ?Assessment and Plan:  ? ?Depression, major, single episode, mild (HCC) ?No red flags ?Continue Captlya 21 mg daily  ?Denies SI/HI ?Informed patient to continue to monitor symptoms  ?Follow up with psychiatrist is new/worsening symptoms or concerns occur ? ?RUQ pain ?No red flags ?Update labs today, will make recommendations accordingly ?Informed patient that if stomach symptoms change, to reach out to office and medication will be trialed  ?Consider updated u/s for updated evaluation of liver ? ?Rash ?No red flags; suspect possibly related to her dermatographism ?Trial topical hydrocortisone ointment, apply to affected area 1-2 times daily  ?Follow up if new/worsening symptoms  or concerns occur ? ?I,Havlyn C Ratchford,acting as a scribe for , PA.,have documented all relevant documentation on the behalf of [name], PA,as directed by  Jarold Motto, PA while in the presence of Jarold Motto, Jarold Motto. ? ?[place], [place], have reviewed all documentation for this visit. The documentation on 12/24/21 for the exam, diagnosis, procedures, and orders are all accurate and complete. ? ?12/26/21, PA-C ? ?

## 2021-12-24 NOTE — Patient Instructions (Signed)
It was great to see you! ? ?Start topical hydrocortisone ointment on your rash (available over the counter as anti-itch cream) ? ?Continue to monitor symptoms and follow-up with your psychiatrist regarding your medications concerns ? ?Please make a lab only appointment on your way out for [place] to recheck your liver labs and other tests ? ?If stomach symptoms change, let us know ? ?Follow-up for annual physical  ? ?Take care, ? ?Inda Coke PA-C  ?

## 2021-12-25 LAB — CBC WITH DIFFERENTIAL/PLATELET
Basophils Absolute: 0.1 10*3/uL (ref 0.0–0.1)
Basophils Relative: 0.7 % (ref 0.0–3.0)
Eosinophils Absolute: 0.3 10*3/uL (ref 0.0–0.7)
Eosinophils Relative: 4.2 % (ref 0.0–5.0)
HCT: 36.2 % (ref 36.0–46.0)
Hemoglobin: 12.1 g/dL (ref 12.0–15.0)
Lymphocytes Relative: 32.1 % (ref 12.0–46.0)
Lymphs Abs: 2.3 10*3/uL (ref 0.7–4.0)
MCHC: 33.5 g/dL (ref 30.0–36.0)
MCV: 87.6 fl (ref 78.0–100.0)
Monocytes Absolute: 0.5 10*3/uL (ref 0.1–1.0)
Monocytes Relative: 7.6 % (ref 3.0–12.0)
Neutro Abs: 3.9 10*3/uL (ref 1.4–7.7)
Neutrophils Relative %: 55.4 % (ref 43.0–77.0)
Platelets: 296 10*3/uL (ref 150.0–400.0)
RBC: 4.14 Mil/uL (ref 3.87–5.11)
RDW: 14 % (ref 11.5–15.5)
WBC: 7.1 10*3/uL (ref 4.0–10.5)

## 2021-12-25 LAB — COMPREHENSIVE METABOLIC PANEL
ALT: 12 U/L (ref 0–35)
AST: 14 U/L (ref 0–37)
Albumin: 4.1 g/dL (ref 3.5–5.2)
Alkaline Phosphatase: 50 U/L (ref 39–117)
BUN: 12 mg/dL (ref 6–23)
CO2: 28 mEq/L (ref 19–32)
Calcium: 9.1 mg/dL (ref 8.4–10.5)
Chloride: 102 mEq/L (ref 96–112)
Creatinine, Ser: 0.7 mg/dL (ref 0.40–1.20)
GFR: 110.91 mL/min (ref >60.00)
Glucose, Bld: 82 mg/dL (ref 70–99)
Potassium: 4.4 mEq/L (ref 3.5–5.1)
Sodium: 136 mEq/L (ref 135–145)
Total Bilirubin: 0.3 mg/dL (ref 0.2–1.2)
Total Protein: 7.4 g/dL (ref 6.0–8.3)

## 2022-01-07 LAB — RESULTS CONSOLE HPV: CHL HPV: NEGATIVE

## 2022-01-07 LAB — HM PAP SMEAR

## 2022-03-24 ENCOUNTER — Telehealth: Payer: 59 | Admitting: Physician Assistant

## 2022-03-24 DIAGNOSIS — Z20818 Contact with and (suspected) exposure to other bacterial communicable diseases: Secondary | ICD-10-CM

## 2022-03-24 DIAGNOSIS — J029 Acute pharyngitis, unspecified: Secondary | ICD-10-CM

## 2022-03-24 MED ORDER — AMOXICILLIN 500 MG PO TABS: 500.0000 mg | ORAL_TABLET | Freq: Two times a day (BID) | ORAL | 0 refills | Status: AC

## 2022-03-24 NOTE — Patient Instructions (Signed)
  [name], thank you for joining [name], PA-C for today's virtual visit.  While this provider is not your primary care provider (PCP), if your PCP is located in our provider database this encounter information will be shared with them immediately following your visit.  Consent: (Patient) Jenny Lane provided verbal consent for this virtual visit at the beginning of the encounter.  Current Medications:  Current Outpatient Medications:    Lumateperone Tosylate (CAPLYTA) 21 MG CAPS, , Disp: , Rfl:    Medications ordered in this encounter:  No orders of the defined types were placed in this encounter.    *If you need refills on other medications prior to your next appointment, please contact your pharmacy*  Follow-Up: Call back or seek an in-person evaluation if the symptoms worsen or if the condition fails to improve as anticipated.  Other Instructions   Based on what you have shared with me it is likely that you have strep pharyngitis.  Strep pharyngitis is inflammation and infection in the back of the throat.  This is an infection cause by bacteria and is treated with antibiotics.  I have prescribed Amoxicillin 500 mg twice a day for 10 days. For throat pain, we recommend over the counter oral pain relief medications such as acetaminophen or aspirin, or anti-inflammatory medications such as ibuprofen or naproxen sodium. Topical treatments such as oral throat lozenges or sprays may be used as needed. Strep infections are not as easily transmitted as other respiratory infections, however we still recommend that you avoid close contact with loved ones, especially the very young and elderly.  Remember to wash your hands thoroughly throughout the day as this is the number one way to prevent the spread of infection and wipe down door knobs and counters with disinfectant.   Home Care: Only take medications as instructed by your medical team. Complete the entire course of an  antibiotic. Do not take these medications with alcohol. A steam or ultrasonic humidifier can help congestion.  You can place a towel over your head and breathe in the steam from hot water coming from a faucet. Avoid close contacts especially the very young and the elderly. Cover your mouth when you cough or sneeze. Always remember to wash your hands.  Get Help Right Away If: You develop worsening fever or sinus pain. You develop a severe head ache or visual changes. Your symptoms persist after you have completed your treatment plan.  Make sure you Understand these instructions. Will watch your condition. Will get help right away if you are not doing well or get worse.    If you have been instructed to have an in-person evaluation today at a local Urgent Care facility, please use the link below. It will take you to a list of all of our available [facility], including address, phone number and hours of operation. Please do not delay care.  [facility]  If you or a family member do not have a primary care provider, use the link below to schedule a visit and establish care. When you choose a [facility] primary care physician or advanced practice provider, you gain a long-term partner in health. Find a Primary Care Provider  Learn more about [facility] in-office and virtual care options: [facility] - Get Care Now

## 2022-03-24 NOTE — Progress Notes (Signed)
Virtual Visit Consent   Jenny Lane, you are scheduled for a virtual visit with a [name] provider today. Just as with appointments in the office, your consent must be obtained to participate. Your consent will be active for this visit and any virtual visit you may have with one of our providers in the next 365 days. If you have a MyChart account, a copy of this consent can be sent to you electronically.  As this is a virtual visit, video technology does not allow for your provider to perform a traditional examination. This may limit your provider's ability to fully assess your condition. If your provider identifies any concerns that need to be evaluated in person or the need to arrange testing (such as labs, EKG, etc.), we will make arrangements to do so. Although advances in technology are sophisticated, we cannot ensure that it will always work on either your end or our end. If the connection with a video visit is poor, the visit may have to be switched to a telephone visit. With either a video or telephone visit, we are not always able to ensure that we have a secure connection.  By engaging in this virtual visit, you consent to the provision of healthcare and authorize for your insurance to be billed (if applicable) for the services provided during this visit. Depending on your insurance coverage, you may receive a charge related to this service.  I need to obtain your verbal consent now. Are you willing to proceed with your visit today? [name] has provided verbal consent on 03/24/2022 for a virtual visit (video or telephone). Jenny Lane, [place]  Date: 03/24/2022 4:10 PM  Virtual Visit via Video Note   I, Jenny Lane, connected with  Savvy Peeters  ([id number], 05/03/1986) on 03/24/22 at  4:00 PM EDT by a video-enabled telemedicine application and verified that I am speaking with the correct person using two identifiers.  Location: Patient: Virtual Visit Location Patient:  Home Provider: Virtual Visit Location Provider: Home Office   I discussed the limitations of evaluation and management by telemedicine and the availability of in person appointments. The patient expressed understanding and agreed to proceed.    History of Present Illness: Jenny Lane is a 36 y.o. who identifies as a female who was assigned female at birth, and is being seen today for sore throat associated with some sneezing. Symptoms starting 2 days ago. Denies difficulty swallowing. Both of her children are currently being treated for strep throat.   HPI: HPI  Problems:  Patient Active Problem List   Diagnosis Date Noted   Fatty liver disease, nonalcoholic 08/02/2021   Dermatographism 12/22/2019   Tuberculosis 12/22/2019   GERD (gastroesophageal reflux disease) 10/26/2019    Allergies:  Allergies  Allergen Reactions   Biaxin [Clarithromycin] Anaphylaxis   Cefdinir Hives    Sneezing, swollen lips, and hives.  Last taken 2 months ago for a sinus infection.    Clindamycin/Lincomycin Hives    Patient experienced low back pain, hives, and a dark line around her bottom lip when taking medication.    Sulfa Antibiotics Hives and Swelling    Lips Swell.  Last taken during childhood.    Tetracyclines & Related     Childhood reaction   Medications:  Current Outpatient Medications:    amoxicillin (AMOXIL) 500 MG tablet, Take 1 tablet (500 mg total) by mouth 2 (two) times daily for 10 days., Disp: 20 tablet, Rfl: 0  Observations/Objective: Patient is well-developed, well-nourished in  no acute distress.  Resting comfortably at home.  Head is normocephalic, atraumatic.  No labored breathing.  Speech is clear and coherent with logical content.  Patient is alert and oriented at baseline.  Posterior oropharyngeal erythema noted without noted exudate or tonsillar enlargement. Uvula midline and without swelling or lesion.   Assessment and Plan: 1. Streptococcus exposure - amoxicillin  (AMOXIL) 500 MG tablet; Take 1 tablet (500 mg total) by mouth 2 (two) times daily for 10 days.  Dispense: 20 tablet; Refill: 0  2. Pharyngitis, unspecified etiology - amoxicillin (AMOXIL) 500 MG tablet; Take 1 tablet (500 mg total) by mouth 2 (two) times daily for 10 days.  Dispense: 20 tablet; Refill: 0  Positive strep exposure from both children. Some nasal congestion but no other significant URI symptoms. Will start Amoxicillin to cover for strep. Supportive measures and OTC medications reviewed.   Follow Up Instructions: I discussed the assessment and treatment plan with the patient. The patient was provided an opportunity to ask questions and all were answered. The patient agreed with the plan and demonstrated an understanding of the instructions.  A copy of instructions were sent to the patient via MyChart unless otherwise noted below.   The patient was advised to call back or seek an in-person evaluation if the symptoms worsen or if the condition fails to improve as anticipated.  Time:  I spent 10 minutes with the patient via telehealth technology discussing the above problems/concerns.    Jenny Climes, PA-C

## 2022-03-27 ENCOUNTER — Encounter: Payer: Self-pay | Admitting: Physician Assistant

## 2022-04-23 ENCOUNTER — Encounter: Payer: Self-pay | Admitting: Physician Assistant

## 2022-04-23 ENCOUNTER — Ambulatory Visit (INDEPENDENT_AMBULATORY_CARE_PROVIDER_SITE_OTHER): Payer: 59 | Admitting: Physician Assistant

## 2022-04-23 VITALS — BP 100/70 | HR 100 | Temp 98.6°F | Ht 62.0 in | Wt 136.0 lb

## 2022-04-23 DIAGNOSIS — J029 Acute pharyngitis, unspecified: Secondary | ICD-10-CM

## 2022-04-23 LAB — POCT RAPID STREP A (OFFICE): Rapid Strep A Screen: NEGATIVE

## 2022-04-23 MED ORDER — IBUPROFEN 600 MG PO TABS: 600.0000 mg | ORAL_TABLET | Freq: Three times a day (TID) | ORAL | 0 refills | Status: DC | PRN

## 2022-04-23 MED ORDER — IBUPROFEN 600 MG PO TABS
600.0000 mg | ORAL_TABLET | Freq: Three times a day (TID) | ORAL | 0 refills | Status: DC | PRN
Start: 2022-04-23 — End: 2022-04-23

## 2022-04-23 NOTE — Patient Instructions (Addendum)
It was great to see you!  You have a viral upper respiratory infection. Antibiotics are not needed for this.  Viral infections usually take 7-10 days to resolve.  The cough can last a few weeks to go away.  Please start taking ibuprofen 400-600 mg every 8 hours to see if this can help your throat pain.  Do this for a few days You may continue flonase  Follow-up if new/worsening symptoms

## 2022-04-23 NOTE — Progress Notes (Signed)
Jenny Lane is a 36 y.o. female here for a follow up of a pre-existing problem.  History of Present Illness:   Chief Complaint  Patient presents with   Sore Throat    Pt c/o sore throat started 2 days ago, also swollen lymph nodes on right side of neck.. Pt was treated for Strep 10 days ago, was on Amoxicillin completed the course.    HPI  Sore throat Patient did a virtual visit with the virtual urgent care team and was given amoxicillin for 10 days.  She took all of this medication.  She states that her symptoms have very slightly improved.  She is also taking Flonase and Tylenol.  She is wondering why she still has some sore throat.  Denies fever, chills, malaise, neck stiffness, fatigue.  Both of her children tested positive for strep prior to onset of symptoms.  Past Medical History:  Diagnosis Date   Depression    History of chicken pox    Tuberculosis    Received 9 Months of Treatement in 2006   Typhoid fever    Varicella    As a child      Social History   Tobacco Use   Smoking status: Never   Smokeless tobacco: Never  Vaping Use   Vaping Use: Never used  Substance Use Topics   Alcohol use: No   Drug use: No    Past Surgical History:  Procedure Laterality Date   VAGINAL DELIVERY      Family History  Problem Relation Age of Onset   Diabetes Father     Allergies  Allergen Reactions   Biaxin [Clarithromycin] Anaphylaxis   Cefdinir Hives    Sneezing, swollen lips, and hives.  Last taken 2 months ago for a sinus infection.    Clindamycin/Lincomycin Hives    Patient experienced low back pain, hives, and a dark line around her bottom lip when taking medication.    Sulfa Antibiotics Hives and Swelling    Lips Swell.  Last taken during childhood.    Tetracyclines & Related     Childhood reaction    Current Medications:  No current outpatient medications on file.   Review of Systems:   ROS Negative unless otherwise specified per HPI.  Vitals:    Vitals:   04/23/22 0918  BP: 100/70  Pulse: 100  Temp: 98.6 F (37 C)  TempSrc: Temporal  SpO2: 97%  Weight: 136 lb (61.7 kg)  Height: 5\' 2"  (1.575 m)     Body mass index is 24.87 kg/m.  Physical Exam:   Physical Exam Vitals and nursing note reviewed.  Constitutional:      General: She is not in acute distress.    Appearance: She is well-developed. She is not ill-appearing or toxic-appearing.  HENT:     Head: Normocephalic and atraumatic.     Right Ear: Tympanic membrane, ear canal and external ear normal. Tympanic membrane is not erythematous, retracted or bulging.     Left Ear: Tympanic membrane, ear canal and external ear normal. Tympanic membrane is not erythematous, retracted or bulging.     Nose: Nose normal.     Right Sinus: No maxillary sinus tenderness or frontal sinus tenderness.     Left Sinus: No maxillary sinus tenderness or frontal sinus tenderness.     Mouth/Throat:     Pharynx: Uvula midline. Posterior oropharyngeal erythema present.     Tonsils: No tonsillar exudate. 1+ on the right. 1+ on the left.  Eyes:  General: Lids are normal.     Conjunctiva/sclera: Conjunctivae normal.  Neck:     Trachea: Trachea normal.  Cardiovascular:     Rate and Rhythm: Normal rate and regular rhythm.     Pulses: Normal pulses.     Heart sounds: Normal heart sounds, S1 normal and S2 normal.  Pulmonary:     Effort: Pulmonary effort is normal.     Breath sounds: Normal breath sounds. No decreased breath sounds, wheezing, rhonchi or rales.  Lymphadenopathy:     Cervical: Cervical adenopathy present.     Right cervical: Superficial cervical adenopathy present.  Skin:    General: Skin is warm and dry.  Neurological:     Mental Status: She is alert.     GCS: GCS eye subscore is 4. GCS verbal subscore is 5. GCS motor subscore is 6.  Psychiatric:        Speech: Speech normal.        Behavior: Behavior normal. Behavior is cooperative.    Results for orders placed or  performed in visit on 04/23/22  POCT rapid strep A  Result Value Ref Range   Rapid Strep A Screen Negative Negative    Assessment and Plan:   Sore throat No red flags on exam Strep test is negative Suspect lingering symptoms from recent infection Recommend taking ibuprofen, 4 to 600 mg 3 times daily for pain and inflammation If symptoms do not improve or if lymph node does not resolve in 1 month, patient is recommended to follow-up in office  Jarold Motto, PA-C

## 2022-04-29 ENCOUNTER — Encounter: Payer: Self-pay | Admitting: Family

## 2022-04-29 ENCOUNTER — Other Ambulatory Visit (HOSPITAL_COMMUNITY)
Admission: RE | Admit: 2022-04-29 | Discharge: 2022-04-29 | Disposition: A | Discharge status: Home / Self Care | Payer: 59 | Source: Ambulatory Visit | Attending: Family | Admitting: Family

## 2022-04-29 ENCOUNTER — Ambulatory Visit (INDEPENDENT_AMBULATORY_CARE_PROVIDER_SITE_OTHER): Payer: 59 | Admitting: Family

## 2022-04-29 VITALS — BP 114/74 | HR 68 | Temp 98.7°F | Ht 62.0 in | Wt 136.6 lb

## 2022-04-29 DIAGNOSIS — N949 Unspecified condition associated with female genital organs and menstrual cycle: Secondary | ICD-10-CM | POA: Insufficient documentation

## 2022-04-29 DIAGNOSIS — R051 Acute cough: Secondary | ICD-10-CM

## 2022-04-29 MED ORDER — BENZONATATE 200 MG PO CAPS: 200.0000 mg | ORAL_CAPSULE | Freq: Three times a day (TID) | ORAL | 0 refills | Status: AC | PRN

## 2022-04-29 MED ORDER — CLOTRIMAZOLE 1 % EX CREA: TOPICAL_CREAM | Freq: Two times a day (BID) | CUTANEOUS | 1 refills | Status: DC

## 2022-04-29 NOTE — Progress Notes (Signed)
Patient ID: Jenny Lane, female    DOB: 11-04-1985, 36 y.o.   MRN: [id number]  Chief Complaint  Patient presents with   Vaginal Pain    Pt c/o rash and burning in vaginal area, Noticed 2 days ago. Pt states she has tried cedphin.     HPI: Vaginal burning:  pt describes burning, irritation in her vaginal area, mild itching. Denies any discharge or foul odor, denies any urinary symptoms. Reports she finished an antibiotic just recently for strep throat. Persistent cough:  reports having strep throat, then developed cough, reports productive, yellow mucus. Denies any other sinus sx, no fever. Has not tried any OTC meds.   Assessment & Plan:  1. Vaginal burning suspect yeast, sending out swab. Sending Clotrimazole cream to use bid, advised on use & SE.  - Cervicovaginal ancillary only - clotrimazole (LOTRIMIN) 1 % cream; Apply topically 2 (two) times daily. Apply to outer vaginal skin.  Dispense: 30 g; Refill: 1  2. Acute cough persistent for over a week, sending Tessalon pearles, advised on use & SE. Drink at least 2L water qd, Ibuprofen 600mg  tid for pleuritic pain.  - benzonatate (TESSALON) 200 MG capsule; Take 1 capsule (200 mg total) by mouth 3 (three) times daily as needed for up to 10 days for cough.  Dispense: 30 capsule; Refill: 0   Subjective:    Outpatient Medications Prior to Visit  Medication Sig Dispense Refill   ibuprofen (ADVIL) 600 MG tablet Take 1 tablet (600 mg total) by mouth every 8 (eight) hours as needed. 30 tablet 0   No facility-administered medications prior to visit.   Past Medical History:  Diagnosis Date   Depression    History of chicken pox    Tuberculosis    Received 9 Months of Treatement in 2006   Typhoid fever    Varicella    As a child    Past Surgical History:  Procedure Laterality Date   VAGINAL DELIVERY     Allergies  Allergen Reactions   Biaxin [Clarithromycin] Anaphylaxis   Cefdinir Hives    Sneezing, swollen lips, and hives.   Last taken 2 months ago for a sinus infection.    Clindamycin/Lincomycin Hives    Patient experienced low back pain, hives, and a dark line around her bottom lip when taking medication.    Sulfa Antibiotics Hives and Swelling    Lips Swell.  Last taken during childhood.    Tetracyclines & Related     Childhood reaction      Objective:    Physical Exam Vitals and nursing note reviewed.  Constitutional:      Appearance: Normal appearance.  Cardiovascular:     Rate and Rhythm: Normal rate and regular rhythm.  Pulmonary:     Effort: Pulmonary effort is normal.     Breath sounds: Normal breath sounds.  Genitourinary:    Exam position: Lithotomy position.     Pubic Area: No rash or pubic lice.      Labia:        Right: Rash (darkened coloration w/redness noted, from distal labial skin to perineum) present.        Left: Rash (darkened coloration w/redness noted, from distal labial skin to perineum) present.     Musculoskeletal:        General: Normal range of motion.  Skin:    General: Skin is warm and dry.  Neurological:     Mental Status: She is alert.  Psychiatric:  Mood and Affect: Mood normal.        Behavior: Behavior normal.    BP 114/74 (BP Location: Left Arm, Patient Position: Sitting, Cuff Size: Large)   Pulse 68   Temp 98.7 F (37.1 C) (Temporal)   Ht 5\' 2"  (1.575 m)   Wt 136 lb 9.6 oz (62 kg)   LMP 03/29/2022 (Exact Date)   SpO2 98%   BMI 24.98 kg/m  Wt Readings from Last 3 Encounters:  04/29/22 136 lb 9.6 oz (62 kg)  04/23/22 136 lb (61.7 kg)  12/24/21 130 lb 12.8 oz (59.3 kg)       12/26/21, NP

## 2022-04-29 NOTE — Patient Instructions (Signed)
It was very nice to see you today!   I will let you know in a few days about your vaginal swab results.  I have sent over a cream to use on the outside vaginal skin where it is irritated to help with the burning and itching. You can use this twice a day, just a thin layer.      PLEASE NOTE:  If you had any lab tests please let us know if you have not heard back within a few days. You may see your results on MyChart before we have a chance to review them but we will give you a call once they are reviewed by [place]. If we ordered any referrals today, please let us know if you have not heard from their office within the next week.

## 2022-05-01 LAB — CERVICOVAGINAL ANCILLARY ONLY
Bacterial Vaginitis (gardnerella): NEGATIVE
Candida Glabrata: NEGATIVE
Candida Vaginitis: NEGATIVE
Chlamydia: NEGATIVE
Comment: NEGATIVE
Comment: NEGATIVE
Comment: NEGATIVE
Comment: NEGATIVE
Comment: NEGATIVE
Comment: NORMAL
Neisseria Gonorrhea: NEGATIVE
Trichomonas: NEGATIVE

## 2022-05-03 NOTE — Progress Notes (Signed)
Your swab testing came up negative for any yeast or Bacterial vaginosis. Hopefully the antifungal cream is giving you some relief.

## 2022-05-25 ENCOUNTER — Encounter: Payer: Self-pay | Admitting: Family

## 2022-05-25 ENCOUNTER — Ambulatory Visit (INDEPENDENT_AMBULATORY_CARE_PROVIDER_SITE_OTHER): Payer: 59 | Admitting: Family

## 2022-05-25 VITALS — BP 92/58 | HR 67 | Temp 98.5°F | Ht 62.0 in | Wt 139.0 lb

## 2022-05-25 DIAGNOSIS — B369 Superficial mycosis, unspecified: Secondary | ICD-10-CM

## 2022-05-25 MED ORDER — FLUCONAZOLE 150 MG PO TABS: 150.0000 mg | ORAL_TABLET | ORAL | 0 refills | Status: DC | PRN

## 2022-05-25 NOTE — Progress Notes (Signed)
Patient ID: Jenny Lane, female    DOB: [birth year], 36 y.o.   MRN: [id number]  Chief Complaint  Patient presents with   Rash    Pt states she has had a rash since august when she came in for her last visit. Pt states it is itchy and slightly burning. Has tried clotrimazole she was prescribed, which did help but rash has returned.     HPI: Vaginitis: Patient complains of an abnormal vaginal discharge for no d/c.  Vaginal symptoms include  itching & skin discoloration. Pt seen almost a month ago & no yeast per vaginal swab, nor STD found. pt reports cream did help, but now she is itching again. STI Risk/HX: low, last visit negative Discharge described as: none Other associated symptoms: no pelvic pain Menstrual pattern: regular  Assessment & Plan:  1. Fungal skin infection sending Diflucan, advised on use & SE,  and pt has refill on cream, reminded pt on use. Continue to advise on keeping vaginal area clean and dry, wear moisture wicking panties.  - fluconazole (DIFLUCAN) 150 MG tablet; Take 1 tablet (150 mg total) by mouth every three (3) days as needed.  Dispense: 2 tablet; Refill: 0   Subjective:    Outpatient Medications Prior to Visit  Medication Sig Dispense Refill   clotrimazole (LOTRIMIN) 1 % cream Apply topically 2 (two) times daily. Apply to outer vaginal skin. 30 g 1   ibuprofen (ADVIL) 600 MG tablet Take 1 tablet (600 mg total) by mouth every 8 (eight) hours as needed. 30 tablet 0   No facility-administered medications prior to visit.   Past Medical History:  Diagnosis Date   Depression    History of chicken pox    Tuberculosis    Received 9 Months of Treatement in 2006   Typhoid fever    Varicella    As a child    Past Surgical History:  Procedure Laterality Date   VAGINAL DELIVERY     Allergies  Allergen Reactions   Biaxin [Clarithromycin] Anaphylaxis   Cefdinir Hives    Sneezing, swollen lips, and hives.  Last taken 2 months ago for a sinus infection.     Clindamycin/Lincomycin Hives    Patient experienced low back pain, hives, and a dark line around her bottom lip when taking medication.    Sulfa Antibiotics Hives and Swelling    Lips Swell.  Last taken during childhood.    Tetracyclines & Related     Childhood reaction      Objective:    Physical Exam Vitals and nursing note reviewed.  Constitutional:      Appearance: Normal appearance.  Cardiovascular:     Rate and Rhythm: Normal rate and regular rhythm.  Pulmonary:     Effort: Pulmonary effort is normal.     Breath sounds: Normal breath sounds.  Genitourinary:    Exam position: Lithotomy position.     Pubic Area: Rash (darkened skin, itchy) present. No pubic lice.      Labia:        Right: Rash (outer, and perineum, darker in color w/itching) present.        Left: Rash (outer, and perineum, darker in color w/itching) present.   Musculoskeletal:        General: Normal range of motion.  Skin:    General: Skin is warm and dry.     Findings: Rash present.  Neurological:     Mental Status: She is alert.  Psychiatric:  Mood and Affect: Mood normal.        Behavior: Behavior normal.    BP (!) 92/58 (BP Location: Left Arm, Patient Position: Sitting, Cuff Size: Large)   Pulse 67   Temp 98.5 F (36.9 C) (Temporal)   Ht 5\' 2"  (1.575 m)   Wt 139 lb (63 kg)   LMP 05/01/2022 (Exact Date)   SpO2 99%   BMI 25.42 kg/m  Wt Readings from Last 3 Encounters:  05/25/22 139 lb (63 kg)  04/29/22 136 lb 9.6 oz (62 kg)  04/23/22 136 lb (61.7 kg)       Jeanie Sewer, NP

## 2022-05-31 ENCOUNTER — Encounter: Payer: Self-pay | Admitting: Family

## 2022-06-15 ENCOUNTER — Ambulatory Visit (INDEPENDENT_AMBULATORY_CARE_PROVIDER_SITE_OTHER): Payer: 59 | Admitting: Internal Medicine

## 2022-06-15 ENCOUNTER — Encounter: Payer: Self-pay | Admitting: Internal Medicine

## 2022-06-15 ENCOUNTER — Encounter: Payer: Self-pay | Admitting: Physician Assistant

## 2022-06-15 VITALS — BP 99/67 | HR 66 | Temp 98.6°F | Resp 12 | Ht 62.0 in | Wt 139.4 lb

## 2022-06-15 DIAGNOSIS — B070 Plantar wart: Secondary | ICD-10-CM | POA: Diagnosis not present

## 2022-06-15 NOTE — Patient Instructions (Addendum)
Cryoablation Cryoablation is a procedure used to remove abnormal growths or cancerous tissue. This is done by freezing the growth or tissue with either liquid nitrogen or argon gas. This procedure is also known as cryotherapy or cryosurgery. This procedure may be done to treat many conditions, including: Skin tumors. Non-cancerous (benign) knots of tissue called nodules. A type of eye cancer (retinoblastoma). Cancers of the prostate, liver, kidney, cervix, lung, and bone. Tell a health care provider about: Any allergies you have. All medicines you are taking, including vitamins, herbs, eye drops, creams, and over-the-counter medicines. Any problems you or family members have had with anesthetic medicines. Any blood disorders you have. Any surgeries you have had. Any medical conditions you have. Whether you are pregnant or may be pregnant. What are the risks? Generally, this is a safe procedure. However, problems may occur, including: Infection. Bleeding. Swelling. Allergic reactions to medicines. Damage to nearby structures or organs, such as damage to nerves, which may cause numbness. This is rare. What happens before the procedure? Staying hydrated Follow instructions from your health care provider about hydration, which may include: Up to 2 hours before the procedure - you may continue to drink clear liquids, such as water, clear fruit juice, black coffee, and plain tea.  Eating and drinking restrictions Follow instructions from your health care provider about eating and drinking, which may include: 8 hours before the procedure - stop eating heavy meals or foods, such as meat, fried foods, or fatty foods. 6 hours before the procedure - stop eating light meals or foods, such as toast or cereal. 6 hours before the procedure - stop drinking milk or drinks that contain milk. 2 hours before the procedure - stop drinking clear liquids. Medicines Ask your health care provider  about: Changing or stopping your regular medicines. This is especially important if you are taking diabetes medicines or blood thinners. Taking medicines such as aspirin and ibuprofen. These medicines can thin your blood. Do not take these medicines unless your health care provider tells you to take them. Taking over-the-counter medicines, vitamins, herbs, and supplements. Tests You may have tests done, including blood tests and imaging tests. General instructions A medical history will be taken, and a physical exam will be done. Do not use any products that contain nicotine or tobacco for at least 4 weeks before the procedure. These products include cigarettes, e-cigarettes, and chewing tobacco. If you need help quitting, ask your health care provider. Ask your health care provider: How your surgery site will be marked. What steps will be taken to help prevent infection. These may include: Removing hair at the surgery site. Washing skin with a germ-killing soap. Receiving antibiotic medicine. Plan to have someone take you home from the hospital or clinic. If you will be going home right after the procedure, plan to have someone with you for 24 hours. What happens during the procedure? An IV will be inserted into one of your veins. You will be given one or more of the following: A medicine to help you relax (sedative). A medicine to numb the area (local anesthetic). A medicine to make you fall asleep (general anesthetic). A medicine that is injected into your spine to numb the area below and slightly above the injection site (spinal anesthetic). A medicine that is injected into an area of your body to numb everything below the injection site (regional anesthetic). A device called a cryoprobe will be used to treat the growth by freezing it. The cryoprobe has liquid   nitrogen or argon gas flowing through it. Depending on how deep in the body the growth is found, the cryoprobe can be: Applied  directly to the area, such as for skin cancers or nodules. Inserted through an incision into the area, such as the prostate. Passed through a thin, long tube (endoscope) to reach deeper areas of the body, such as the lungs or liver. The cryoprobe may be guided using imaging, such as an ultrasound, a CT scan, or an MRI. Liquid nitrogen or argon gas will be delivered through the cryoprobe to the growth until it is frozen and destroyed. The process may be repeated on other areas depending on how many areas need treatment. The cryoprobe will be removed, and pressure will be applied to stop any bleeding. If an incision was made, it will be closed with stitches (sutures) and covered with a bandage (dressing). The procedure will vary depending on the location of the growth. The procedure may also vary among health care providers and hospitals. What happens after the procedure?  Your blood pressure, heart rate, breathing rate, and blood oxygen level will be monitored until you leave the hospital or clinic. You will be given medicine to help with pain, nausea, and vomiting as needed. If you were given a sedative during the procedure, it can affect you for several hours. Do not drive or operate machinery until your health care provider says that it is safe. Summary Cryoablation is a procedure used to remove abnormal growths or cancerous tissue. This is done by freezing the growth or tissue with either liquid nitrogen or argon gas. If you will be going home right after the procedure, plan to have someone with you for 24 hours. Your health care provider may use a device called a cryoprobe that has liquid nitrogen or argon gas flowing through it. Liquid nitrogen or argon gas will be delivered through the cryoprobe to the growth until it is frozen and destroyed. The process may be repeated on other areas depending on how many areas need treatment. This information is not intended to replace advice given to you  by your health care provider. Make sure you discuss any questions you have with your health care provider. Document Revised: 06/01/2019 Document Reviewed: 06/01/2019 Elsevier Patient Education  2023 Elsevier Inc. Plantar Warts Plantar warts are small growths on the bottom of the foot (sole). Warts are caused by a type of germ (virus). Most warts are not painful, and they usually do not cause problems. Sometimes, plantar warts can cause pain when you walk. Warts often go away on their own in time. They can also spread to other areas of the body. Treatments may be done if needed. What are the causes? Plantar warts are caused by a germ that is called human papillomavirus (HPV). Walking barefoot can cause exposure to the germ, especially if your feet are wet. Warts happen when HPV attacks a break in the skin of the foot. What increases the risk? Being between 23 and 52 years of age. Using public showers or locker rooms. Having a weakened body defense system (immune system). What are the signs or symptoms?  Flat or slightly raised growths that have a rough surface and look like a callus. Pain when you use your foot to support your body weight. How is this treated? In many cases, warts do not need treatment. Without treatment, they often go away with time. If treatment is needed or wanted, options may include: Applying medicated solutions, creams, or patches to  the wart. These make the skin soft so that layers will slowly shed away. Freezing the wart with liquid nitrogen (cryotherapy). Burning the wart with: Laser treatment. An electrified probe (electrocautery). Injecting a medicine (Candida antigen) into the wart to help the body's defense system fight off the wart. Having surgery to remove the wart. Putting duct tape over the top of the wart (occlusion). You will leave the tape in place for as long as told by your doctor. Then you will replace it with a new strip of tape. This is done until  the wart goes away. Repeat treatment may be needed if you choose to remove warts. Warts sometimes go away and come back again. Follow these instructions at home: General instructions Apply creams or solutions only as told by your doctor. Follow these steps if your doctor tells you to do so: Soak your foot in warm water. Remove the top layer of softened skin before you apply the medicine. You can use a pumice stone to remove the skin. After you apply the medicine, put a bandage over the area of the wart. Repeat the process every day or as told by your doctor. Do not scratch or pick at a wart. Wash your hands after you touch a wart. If a wart hurts, try covering it with a bandage that has a hole in the middle. Keep all follow-up visits as told by your doctor. This is important. How is this prevented?  Wear shoes and socks. Change your socks every day. Keep your feet clean and dry. Check your feet often. Do not walk barefoot in: Shared locker rooms. Shower areas. Swimming pools. Avoid direct contact with warts on other people. Contact a doctor if: Your warts do not improve after treatment. You have redness, swelling, or pain at the site of a wart. You have bleeding from a wart, and the bleeding does not stop when you put light pressure on the wart. You have diabetes and you get a wart. Summary Warts are small growths on the skin. When warts happen on the bottom of the foot (sole), they are called plantar warts. In many cases, warts do not need treatment. Apply creams or solutions only as told by your doctor. Do not scratch or pick at a wart. Wash your hands after you touch a wart. This information is not intended to replace advice given to you by your health care provider. Make sure you discuss any questions you have with your health care provider. Document Revised: 12/12/2020 Document Reviewed: 12/12/2020 Elsevier Patient Education  [place].

## 2022-06-15 NOTE — Progress Notes (Signed)
  [place] at [employer]:  [phone]   Routine Medical Office Visit  Patient:  Jenny Lane      Age: 36 y.o.       Sex:  female  Date:   06/15/2022  PCP:    Inda Coke, [place] Provider: Loralee Pacas, MD  Assessment/Plan:   Kina was seen today for callus on foot.  Plantar wart, right foot   Discussed mgmt, offered cryo, she agreed, treated-  will expect full resolution.    Subjective:   Jenny Lane is a 36 y.o. female with PMH significant for: Past Medical History:  Diagnosis Date   Depression    History of chicken pox    Tuberculosis    Received 9 Months of Treatement in 2006   Typhoid fever    Varicella    As a child     She is presenting today with: Chief Complaint  Patient presents with   Callus on foot    On the bottom of right foot under pinky toe, painful for about three days.     Additional physician collected history: No definitive moment where she felt a splinter Noticed 3 days ago due to tenderness at that time.             Objective:  Physical Exam: BP 99/67 (BP Location: Left Arm, Patient Position: Sitting)   Pulse 66   Temp 98.6 F (37 C) (Temporal)   Resp 12   Ht 5\' 2"  (1.575 m)   Wt 139 lb 6.4 oz (63.2 kg)   LMP 05/01/2022 (Exact Date)   SpO2 98%   BMI 25.50 kg/m   She  is a polite, friendly, and genuine person Constitutional: NAD, AAO, not ill-appearing  Neuro: alert, no focal deficit obvious, articulate speech Psych: normal mood, behavior, thought content   Problem specific physical exam findings:  Tender hard punctate skin lesion left metatarsophalangeal plantar surface 3-4 mm diam, no erythema, treated with cryo  Liquid nitrogen was applied for 10-12 seconds to the single skin lesion and the expected blistering or scabbing reaction explained. Do not pick at the areas. Return if lesions fail to fully resolve.   Results:  No results found for any visits on 06/15/22.   Recent  Results (from the past 2160 hour(s))  POCT rapid strep A     Status: None   Collection Time: 04/23/22  9:23 AM  Result Value Ref Range   Rapid Strep A Screen Negative Negative  Cervicovaginal ancillary only     Status: None   Collection Time: 04/29/22  4:52 PM  Result Value Ref Range   Neisseria Gonorrhea Negative    Chlamydia Negative    Trichomonas Negative    Bacterial Vaginitis (gardnerella) Negative    Candida Vaginitis Negative    Candida Glabrata Negative    Comment      Normal Reference Range Bacterial Vaginosis - Negative   Comment Normal Reference Range Candida Species - Negative    Comment Normal Reference Range Candida Galbrata - Negative    Comment Normal Reference Range Trichomonas - Negative    Comment Normal Reference [place] Chlamydia - Negative    Comment      Normal Reference Range Neisseria Gonorrhea - Negative

## 2022-06-26 ENCOUNTER — Encounter: Payer: Self-pay | Admitting: Family

## 2022-07-30 ENCOUNTER — Encounter: Payer: Self-pay | Admitting: Physician Assistant

## 2022-08-01 ENCOUNTER — Encounter: Payer: Self-pay | Admitting: Physician Assistant

## 2022-08-01 ENCOUNTER — Telehealth: Payer: 59 | Admitting: Urgent Care

## 2022-08-01 DIAGNOSIS — A084 Viral intestinal infection, unspecified: Secondary | ICD-10-CM

## 2022-08-01 MED ORDER — ONDANSETRON 4 MG PO TBDP: 4.0000 mg | ORAL_TABLET | Freq: Three times a day (TID) | ORAL | 0 refills | Status: DC | PRN

## 2022-08-01 NOTE — Patient Instructions (Signed)
  [name], thank you for joining [name], PA for today's virtual visit.  While this provider is not your primary care provider (PCP), if your PCP is located in our provider database this encounter information will be shared with them immediately following your visit.   A [facility] MyChart account gives you access to today's visit and all your visits, tests, and labs performed at [facility] " click here if you don't have a [facility] MyChart account or go to [email or web address]  Consent: (Patient) Jenny Lane provided verbal consent for this virtual visit at the beginning of the encounter.  Current Medications:  Current Outpatient Medications:    ondansetron (ZOFRAN-ODT) 4 MG disintegrating tablet, Take 1 tablet (4 mg total) by mouth every 8 (eight) hours as needed for nausea or vomiting., Disp: 20 tablet, Rfl: 0   clotrimazole (LOTRIMIN) 1 % cream, Apply topically 2 (two) times daily. Apply to outer vaginal skin., Disp: 30 g, Rfl: 1   ziprasidone (GEODON) 20 MG capsule, SMARTSIG:1 Capsule(s) By Mouth Every Evening, Disp: , Rfl:    Medications ordered in this encounter:  Meds ordered this encounter  Medications   ondansetron (ZOFRAN-ODT) 4 MG disintegrating tablet    Sig: Take 1 tablet (4 mg total) by mouth every 8 (eight) hours as needed for nausea or vomiting.    Dispense:  20 tablet    Refill:  0    Order Specific Question:   Supervising Provider    Answer:   Merrilee Jansky [id number]     *If you need refills on other medications prior to your next appointment, please contact your pharmacy*  Follow-Up: Call back or seek an in-person evaluation if the symptoms worsen or if the condition fails to improve as anticipated.  [facility] [phone]  Other Instructions Your symptoms sound like viral gastroenteritis Take zofran as needed for nausea or vomiting, you place this medication under the tongue. Take small sips of water,  pedialyte, gatorade until tolerating. Eat BLAND foods - bananas, rice, apples, toast, boiled chicken, saltine crackers. Avoid spicy or greasy foods.  This should resolve within 2-3 days. If any new, worsening, or persistent symptoms, please head to an in office Urgent care evaluation.   If you have been instructed to have an in-person evaluation today at a local Urgent Care facility, please use the link below. It will take you to a list of all of our available [facility], including address, phone number and hours of operation. Please do not delay care.  [facility]  If you or a family member do not have a primary care provider, use the link below to schedule a visit and establish care. When you choose a [facility] primary care physician or advanced practice provider, you gain a long-term partner in health. Find a Primary Care Provider  Learn more about [facility]'s in-office and virtual care options: [facility] - Get Care Now

## 2022-08-01 NOTE — Progress Notes (Signed)
Virtual Visit Consent   Jenny Lane, you are scheduled for a virtual visit with a [facility] provider today. Just as with appointments in the office, your consent must be obtained to participate. Your consent will be active for this visit and any virtual visit you may have with one of our providers in the next 365 days. If you have a MyChart account, a copy of this consent can be sent to you electronically.  As this is a virtual visit, video technology does not allow for your provider to perform a traditional examination. This may limit your provider's ability to fully assess your condition. If your provider identifies any concerns that need to be evaluated in person or the need to arrange testing (such as labs, EKG, etc.), we will make arrangements to do so. Although advances in technology are sophisticated, we cannot ensure that it will always work on either your end or our end. If the connection with a video visit is poor, the visit may have to be switched to a telephone visit. With either a video or telephone visit, we are not always able to ensure that we have a secure connection.  By engaging in this virtual visit, you consent to the provision of healthcare and authorize for your insurance to be billed (if applicable) for the services provided during this visit. Depending on your insurance coverage, you may receive a charge related to this service.  I need to obtain your verbal consent now. Are you willing to proceed with your visit today? Jenny Lane has provided verbal consent on 08/01/2022 for a virtual visit (video or telephone). Jenny Bees, PA  Date: 08/01/2022 6:38 PM  Virtual Visit via Video Note   I, Jenny Lane, connected with  Jenny Lane  ([id number], 10-22-1985) on 08/01/22 at  6:30 PM EST by a video-enabled telemedicine application and verified that I am speaking with the correct person using two identifiers.  Location: Patient: Virtual Visit Location Patient:  Home Provider: Virtual Visit Location Provider: Home Office   I discussed the limitations of evaluation and management by telemedicine and the availability of in person appointments. The patient expressed understanding and agreed to proceed.    History of Present Illness: Jenny Lane is a 36 y.o. who identifies as a female who was assigned female at birth, and is being seen today for N/V/D.  HPI: Pleasant 36yo female presents today with acute onset of nausea, vomiting and diarrhea today. 3-4 days ago, her son had the same issue. He improved without any treatment. 2 days ago, her 4yo daughter had the same thing. Her symptoms are now also resolved. Pt states since this morning she has been nauseated, vomited twice, and had one episode of loose stool. She reports generalized crampy abdominal pain. No blood in stool, no fever. Has not taken anything OTC for her symptoms. Denies any additional symptoms.     Problems:  Patient Active Problem List   Diagnosis Date Noted   Fatty liver disease, nonalcoholic 08/02/2021   Dermatographism 12/22/2019   Tuberculosis 12/22/2019   GERD (gastroesophageal reflux disease) 10/26/2019    Allergies:  Allergies  Allergen Reactions   Biaxin [Clarithromycin] Anaphylaxis   Cefdinir Hives    Sneezing, swollen lips, and hives.  Last taken 2 months ago for a sinus infection.    Clindamycin/Lincomycin Hives    Patient experienced low back pain, hives, and a dark line around her bottom lip when taking medication.    Sulfa Antibiotics Hives and Swelling  Lips Swell.  Last taken during childhood.    Tetracyclines & Related     Childhood reaction   Medications:  Current Outpatient Medications:    ondansetron (ZOFRAN-ODT) 4 MG disintegrating tablet, Take 1 tablet (4 mg total) by mouth every 8 (eight) hours as needed for nausea or vomiting., Disp: 20 tablet, Rfl: 0   clotrimazole (LOTRIMIN) 1 % cream, Apply topically 2 (two) times daily. Apply to outer vaginal  skin., Disp: 30 g, Rfl: 1   ziprasidone (GEODON) 20 MG capsule, SMARTSIG:1 Capsule(s) By Mouth Every Evening, Disp: , Rfl:   Observations/Objective: Patient is well-developed, well-nourished in no acute distress.  Resting comfortably at home. Non-toxic appearing. Head is normocephalic, atraumatic.  No labored breathing.  Speech is clear and coherent with logical content.  Patient is alert and oriented at baseline.  Mouth appears moist and skin appears hydrated. Pt pressed on abdomen, no rigidity or guarding noted.  Assessment and Plan: 1. Viral gastroenteritis  Pt exposed to two others with similar sx which have spontaneously regressed. This favors VGE as the cause. Will call in zofran for pt better tolerate liquids. Fluid challenge and BRAT diet reviewed. Recommended avoiding OTC antidiarrheal medications.  Follow Up Instructions: I discussed the assessment and treatment plan with the patient. The patient was provided an opportunity to ask questions and all were answered. The patient agreed with the plan and demonstrated an understanding of the instructions.  A copy of instructions were sent to the patient via MyChart unless otherwise noted below.    The patient was advised to call back or seek an in-person evaluation if the symptoms worsen or if the condition fails to improve as anticipated.  Time:  I spent 8 minutes with the patient via telehealth technology discussing the above problems/concerns.    Jenny Lumadue L Yaacov Koziol, PA

## 2022-08-03 ENCOUNTER — Encounter: Payer: Self-pay | Admitting: Family

## 2022-08-03 ENCOUNTER — Ambulatory Visit (INDEPENDENT_AMBULATORY_CARE_PROVIDER_SITE_OTHER): Payer: 59 | Admitting: Family

## 2022-08-03 VITALS — BP 104/68 | HR 78 | Temp 98.6°F | Ht 62.0 in | Wt 139.4 lb

## 2022-08-03 DIAGNOSIS — R1084 Generalized abdominal pain: Secondary | ICD-10-CM | POA: Diagnosis not present

## 2022-08-03 DIAGNOSIS — B369 Superficial mycosis, unspecified: Secondary | ICD-10-CM | POA: Diagnosis not present

## 2022-08-03 DIAGNOSIS — N949 Unspecified condition associated with female genital organs and menstrual cycle: Secondary | ICD-10-CM | POA: Diagnosis not present

## 2022-08-03 DIAGNOSIS — R1011 Right upper quadrant pain: Secondary | ICD-10-CM

## 2022-08-03 MED ORDER — CLOTRIMAZOLE 1 % EX CREA: TOPICAL_CREAM | Freq: Two times a day (BID) | CUTANEOUS | 1 refills | Status: DC

## 2022-08-03 NOTE — Telephone Encounter (Signed)
See other message

## 2022-08-03 NOTE — Progress Notes (Signed)
Patient ID: Jenny Lane, female    DOB: [birth year], 35 y.o.   MRN: [id number]  Chief Complaint  Patient presents with   Abdominal Pain    Pt c/o nausea and vomiting. Telehealth and given zofran and tried pepto bismol which did help a little. Pt states her abdomen is still bloated but pain moved to right side of abdomen, present for 3 days.    Vaginitis    Pt c/o vaginal itching and odor ongoing off and on for weeks. Has been using a cream she was given which does help with itching.     HPI:      Vaginal sx:  Pt c/o vaginal itching and odor ongoing off and on for weeks. Has been using a cream she was given which does help with itching. Denies any discharge.  Abd pain:  Pt c/o nausea and vomiting. Had an evist via telehealth and given zofran and tried pepto bismol which did help a little. Pt states her abdomen is still bloated but pain moved to right side of abdomen, present for 3 days. She reports having a small, hard stool today.   Assessment & Plan:  1. Fungal skin infection - pt reports using the antifungal cream which does help, needing a refill. Continue to advised on keeping skin dry to prevent fungus. Advised ok to use OTC feminine hygiene wipes tid to help with odor, just be sure to dry skin well after using.  - clotrimazole (LOTRIMIN) 1 % cream; Apply topically 2 (two) times daily. Apply to outer vaginal skin.  Dispense: 30 g; Refill: 1  2. Generalized abdominal pain - mostly bloating, advised pt to not take the Zofran any longer if no nausea as may be constipating. Pt has Miralax at home, advised to use 1-2 scoops daily until she has a soft BM, then stop. Drink at least 2L of water daily and eat a high fiber diet.   Subjective:    Outpatient Medications Prior to Visit  Medication Sig Dispense Refill   clotrimazole (LOTRIMIN) 1 % cream Apply topically 2 (two) times daily. Apply to outer vaginal skin. 30 g 1   ondansetron (ZOFRAN-ODT) 4 MG disintegrating tablet Take 1 tablet (4  mg total) by mouth every 8 (eight) hours as needed for nausea or vomiting. 20 tablet 0   ziprasidone (GEODON) 20 MG capsule SMARTSIG:1 Capsule(s) By Mouth Every Evening     No facility-administered medications prior to visit.   Past Medical History:  Diagnosis Date   Depression    History of chicken pox    Tuberculosis    Received 9 Months of Treatement in 2006   Typhoid fever    Varicella    As a child    Past Surgical History:  Procedure Laterality Date   VAGINAL DELIVERY     Allergies  Allergen Reactions   Biaxin [Clarithromycin] Anaphylaxis   Cefdinir Hives    Sneezing, swollen lips, and hives.  Last taken 2 months ago for a sinus infection.    Clindamycin/Lincomycin Hives    Patient experienced low back pain, hives, and a dark line around her bottom lip when taking medication.    Sulfa Antibiotics Hives and Swelling    Lips Swell.  Last taken during childhood.    Tetracyclines & Related     Childhood reaction      Objective:    Physical Exam Vitals and nursing note reviewed.  Constitutional:      Appearance: Normal appearance.  Cardiovascular:  Rate and Rhythm: Normal rate and regular rhythm.  Pulmonary:     Effort: Pulmonary effort is normal.     Breath sounds: Normal breath sounds.  Musculoskeletal:        General: Normal range of motion.  Skin:    General: Skin is warm and dry.  Neurological:     Mental Status: She is alert.  Psychiatric:        Mood and Affect: Mood normal.        Behavior: Behavior normal.    BP 104/68 (BP Location: Left Arm, Patient Position: Sitting, Cuff Size: Large)   Pulse 78   Temp 98.6 F (37 C) (Temporal)   Ht 5\' 2"  (1.575 m)   Wt 139 lb 6 oz (63.2 kg)   LMP 07/15/2022 (Exact Date)   SpO2 99%   BMI 25.49 kg/m  Wt Readings from Last 3 Encounters:  08/03/22 139 lb 6 oz (63.2 kg)  06/15/22 139 lb 6.4 oz (63.2 kg)  05/25/22 139 lb (63 kg)       05/27/22, NP

## 2022-08-13 ENCOUNTER — Ambulatory Visit
Admission: RE | Admit: 2022-08-13 | Discharge: 2022-08-13 | Disposition: A | Discharge status: Home / Self Care | Payer: 59 | Source: Ambulatory Visit | Attending: Family | Admitting: Family

## 2022-08-13 DIAGNOSIS — R1011 Right upper quadrant pain: Secondary | ICD-10-CM

## 2022-08-13 NOTE — Progress Notes (Signed)
Your Ultrasound is normal, no problems with your gallbladder and no gallstones found.   Take care.

## 2022-09-17 ENCOUNTER — Encounter: Payer: Self-pay | Admitting: Family

## 2022-10-31 ENCOUNTER — Other Ambulatory Visit: Payer: Self-pay | Admitting: Family Medicine

## 2022-10-31 DIAGNOSIS — R300 Dysuria: Secondary | ICD-10-CM

## 2022-10-31 MED ORDER — CIPROFLOXACIN HCL 250 MG PO TABS: 250.0000 mg | ORAL_TABLET | Freq: Two times a day (BID) | ORAL | 0 refills | Status: AC

## 2022-11-02 ENCOUNTER — Telehealth: Payer: Self-pay | Admitting: Physician Assistant

## 2022-11-02 ENCOUNTER — Ambulatory Visit (INDEPENDENT_AMBULATORY_CARE_PROVIDER_SITE_OTHER): Payer: No Typology Code available for payment source | Admitting: Physician Assistant

## 2022-11-02 ENCOUNTER — Encounter: Payer: Self-pay | Admitting: Physician Assistant

## 2022-11-02 VITALS — BP 110/70 | HR 71 | Temp 99.3°F | Ht 62.0 in | Wt 146.0 lb

## 2022-11-02 DIAGNOSIS — R102 Pelvic and perineal pain: Secondary | ICD-10-CM

## 2022-11-02 DIAGNOSIS — R143 Flatulence: Secondary | ICD-10-CM

## 2022-11-02 DIAGNOSIS — R300 Dysuria: Secondary | ICD-10-CM | POA: Diagnosis not present

## 2022-11-02 LAB — POCT URINALYSIS DIPSTICK
Bilirubin, UA: NEGATIVE
Blood, UA: 3
Glucose, UA: NEGATIVE
Ketones, UA: NEGATIVE
Nitrite, UA: NEGATIVE
Protein, UA: NEGATIVE
Spec Grav, UA: <=1.005 — AB (ref 1.010–1.025)
Urobilinogen, UA: 0.2 E.U./dL
pH, UA: 7 (ref 5.0–8.0)

## 2022-11-02 LAB — POCT URINE PREGNANCY: Preg Test, Ur: NEGATIVE

## 2022-11-02 NOTE — Telephone Encounter (Signed)
RX was called in for patient by on call provider.   Patient Name: Jenny Lane Gender: Female DOB: 08-13-1986 Age: 37 Y 83 M 18 D Return Phone Number: [phone] (Primary) Address: City/ State/ Zip: [place]  [address] [facility] Client [facility] Provider Inda Coke- [place] Contact Type Call Who Is Calling Patient / Member / Family / Caregiver Call Type Triage / Clinical Relationship To Patient Self Return Phone Number [phone] (Primary) Chief Complaint Urination Pain Reason for Call Symptomatic / Request for Health Information Initial Comment Having urination pain. Translation No Nurse Assessment Nurse: Dellie Catholic, RN, Cindy Date/Time (Eastern Time): 10/31/2022 1:28:53 PM Confirm and document reason for call. If symptomatic, describe symptoms. ---caller states burning on urination beginning last night Does the patient have any new or worsening symptoms? ---Yes Will a triage be completed? ---Yes Related visit to physician within the last 2 weeks? ---No Does the PT have any chronic conditions? (i.e. diabetes, asthma, this includes High risk factors for pregnancy, etc.) ---No Is the patient pregnant or possibly pregnant? (Ask all females between the ages of 77-55) ---No Is this a behavioral health or substance abuse call? ---No Guidelines Guideline Title Affirmed Question Affirmed Notes Nurse Date/Time Eilene Ghazi Time) Urination Pain - Female [1] Painful urination AND [2] EITHER frequency or urgency AND [3] has on-call doctor Dellie Catholic, RN, [place] 10/31/2022 1:29:40 PM Disp. Time Eilene Ghazi Time) Disposition Final User 10/31/2022 1:32:24 PM Call PCP within 24 Hours Yes [place], RN, [employer]. Time Eilene Ghazi Time) Disposition Final User 10/31/2022 1:35:03 PM Paged On Call back to Call Center [place], [place] Final Disposition 10/31/2022 1:32:24 PM Call PCP within 24 Hours Yes  Dellie Catholic, RN, Jenny Reichmann Caller Disagree/Comply Comply Caller Understands Yes PreDisposition Call Doctor Care Advice Given Per Guideline CALL PCP WITHIN 24 HOURS: * IF OFFICE WILL BE CLOSED: I'll page the on-call provider now. EXCEPTION: from 9 pm to 9 am. Since this isn't urgent, we'll hold the page until morning. DRINK EXTRA FLUIDS: CRANBERRY JUICE: CALL BACK IF: * Fever occurs * You become worse CARE ADVICE given per Urination Pain - Female (Adult) guideline. Comments User: Gillian Shields, RN Date/Time Eilene Ghazi Time): 10/31/2022 1:34:54 PM [facility]) [phone] User: Gillian Shields, RN Date/Time Eilene Ghazi Time): 10/31/2022 1:41:40 PM Rx sent by ocp . Advised if pt not better by Monday follow up with office . Referrals REFERRED TO PCP OFFICE Paging DoctorName Phone DateTime Result/ Outcome Message Type Notes Roma Schanz- MD [phone] 10/31/2022 1:35:03 PM Paged On Call Back to Call Center Doctor Paged Cindy Nurse : [phone] Pt has burning on Urination Roma Schanz- MD 10/31/2022 1:41:01 PM Spoke with On Call - General Message Result rx sent

## 2022-11-02 NOTE — Progress Notes (Signed)
Jenny Lane is a 37 y.o. female here for a new problem.  History of Present Illness:   Chief Complaint  Patient presents with   Dysuria    Pt c/o burning with urination and frequency, started 2-3 days ago. Denies back pain. Pt took Ibuprofen 600 mg this morning.   GI Problem    Pt c/o passing a lot of gas more than usual.    Dysuria   GI Problem The primary symptoms include dysuria.    UTI symptoms Sx started 2-3 days ago She actually called on-call provider over weekend and was sent in some cipro 250 mg BID - she is taking as prescribed - has had some improvement of sx First day had some chills, then urgency and burning Denies: fevers, severe back pain, malaise, hematuria, concerns for pregnancy, unusual vaginal discharge  Vaginal pain Patient reports intermittent vaginal pain Denies known trauma Pain is not all the time Denies symptoms at this time  Flatus Having gas from [place] frequently Has tried tums without relief No pain, n/v/d/c She has not told prescriber   Past Medical History:  Diagnosis Date   Depression    History of chicken pox    Tuberculosis    Received 9 Months of Treatement in 2006   Typhoid fever    Varicella    As a child      Social History   Tobacco Use   Smoking status: Never   Smokeless tobacco: Never  Vaping Use   Vaping Use: Never used  Substance Use Topics   Alcohol use: No   Drug use: No    Past Surgical History:  Procedure Laterality Date   VAGINAL DELIVERY      Family History  Problem Relation Age of Onset   Diabetes Father     Allergies  Allergen Reactions   Biaxin [Clarithromycin] Anaphylaxis   Cefdinir Hives    Sneezing, swollen lips, and hives.  Last taken 2 months ago for a sinus infection.    Clindamycin/Lincomycin Hives    Patient experienced low back pain, hives, and a dark line around her bottom lip when taking medication.    Sulfa Antibiotics Hives and Swelling    Lips Swell.  Last taken  during childhood.    Tetracyclines & Related     Childhood reaction    Current Medications:   Current Outpatient Medications:    ciprofloxacin (CIPRO) 250 MG tablet, Take 1 tablet (250 mg total) by mouth 2 (two) times daily for 3 days., Disp: 6 tablet, Rfl: 0   clotrimazole (LOTRIMIN) 1 % cream, Apply topically 2 (two) times daily. Apply to outer vaginal skin., Disp: 30 g, Rfl: 1   ziprasidone (GEODON) 20 MG capsule, SMARTSIG:1 Capsule(s) By Mouth Every Evening, Disp: , Rfl:    Review of Systems:   Review of Systems  Genitourinary:  Positive for dysuria.   Negative unless otherwise specified per HPI.  Vitals:   Vitals:   11/02/22 1535  BP: 110/70  Pulse: 71  Temp: 99.3 F (37.4 C)  TempSrc: Temporal  SpO2: 99%  Weight: 146 lb (66.2 kg)  Height: '5\' 2"'$  (1.575 m)     Body mass index is 26.7 kg/m.  Physical Exam:   Physical Exam Vitals and nursing note reviewed. Exam conducted with a chaperone present.  Constitutional:      General: She is not in acute distress.    Appearance: She is well-developed. She is not ill-appearing or toxic-appearing.  Cardiovascular:  Rate and Rhythm: Normal rate and regular rhythm.     Pulses: Normal pulses.     Heart sounds: Normal heart sounds, S1 normal and S2 normal.  Pulmonary:     Effort: Pulmonary effort is normal.     Breath sounds: Normal breath sounds.  Genitourinary:    Vagina: No signs of injury. Bleeding (very mild) present. No tenderness.     Cervix: Normal.  Skin:    General: Skin is warm and dry.  Neurological:     Mental Status: She is alert.     GCS: GCS eye subscore is 4. GCS verbal subscore is 5. GCS motor subscore is 6.  Psychiatric:        Speech: Speech normal.        Behavior: Behavior normal. Behavior is cooperative.    Results for orders placed or performed in visit on 11/02/22  POCT urinalysis dipstick  Result Value Ref Range   Color, UA yellow    Clarity, UA clear    Glucose, UA Negative Negative    Bilirubin, UA Negative    Ketones, UA Negative    Spec Grav, UA <=1.005 (A) 1.010 - 1.025   Blood, UA 3    pH, UA 7.0 5.0 - 8.0   Protein, UA Negative Negative   Urobilinogen, UA 0.2 0.2 or 1.0 E.U./dL   Nitrite, UA Negative    Leukocytes, UA Small (1+) (A) Negative   Appearance     Odor    POCT urine pregnancy  Result Value Ref Range   Preg Test, Ur Negative Negative      Assessment and Plan:   Dysuria UA is consistent with possible acute cystitis She denies concerns for pregnancy - urine preg is negative in office Complete the cipro as prescribe Urine culture pending Follow-up if new/worsening sx Declines need for STI testing  Vaginal pain No pain on exam No abnormality seen Continue to monitor sx and follow-up  Passing flatus Recommend Gas-X and reaching out to prescriber   Inda Coke, PA-C

## 2022-11-02 NOTE — Patient Instructions (Addendum)
It was great to see you!  I will be in touch with your urine culture results and the plan  Keep me posted on your vaginal pain -- your exam was normal in the office today  Trial Gas-X for your gas pain  Take care,  Inda Coke PA-C

## 2022-11-02 NOTE — Telephone Encounter (Signed)
See triage note, pt is scheduled today.

## 2022-11-03 LAB — URINE CULTURE
MICRO NUMBER:: 14614219
SPECIMEN QUALITY:: ADEQUATE

## 2022-11-06 ENCOUNTER — Other Ambulatory Visit: Payer: Self-pay | Admitting: Physician Assistant

## 2022-11-06 ENCOUNTER — Encounter: Payer: Self-pay | Admitting: Physician Assistant

## 2022-11-06 ENCOUNTER — Other Ambulatory Visit: Payer: Self-pay

## 2022-11-06 MED ORDER — LIDOCAINE 0.5 % EX GEL: 1.0000 "application " | CUTANEOUS | 0 refills | Status: DC | PRN

## 2022-11-09 NOTE — Telephone Encounter (Signed)
Spoke to pt asked her if she was able to get the Lidocaine gel? Pt said no, they were contacting you for something else. Apologized to pt I was out Friday afternoon and Aldona Bar has been out also, she will not be back til tomorrow and I will ask her what else to prescribe. Pt verbalized understanding and said she has no pain now is doing better. Told her okay, be in touch tomorrow.

## 2022-11-10 NOTE — Telephone Encounter (Signed)
Spoke to pt told her Aldona Bar said, at this point, I would like to refer to gynecology. Her pain is doing better, per your message. But an appointment with gynecology can help [place] figure out what to do if it happens again. Pt verbalized understanding asked if it will be with her GYN? Asked pt do you have a GYN? Pt said yes. Then please contact your GYN so they can evaluate you and come up with a plan for next time. Pt verbalized understanding and will contact GYN.

## 2022-11-10 NOTE — Telephone Encounter (Signed)
Jenny Lane, pharmacy called Lidocaine gel is not available. Please advise.

## 2022-12-28 ENCOUNTER — Encounter: Payer: No Typology Code available for payment source | Admitting: Physician Assistant

## 2023-01-08 ENCOUNTER — Encounter: Payer: No Typology Code available for payment source | Admitting: Physician Assistant

## 2023-02-08 ENCOUNTER — Encounter: Payer: Self-pay | Admitting: Physician Assistant

## 2023-02-08 ENCOUNTER — Ambulatory Visit (INDEPENDENT_AMBULATORY_CARE_PROVIDER_SITE_OTHER): Payer: No Typology Code available for payment source | Admitting: Physician Assistant

## 2023-02-08 ENCOUNTER — Encounter: Payer: No Typology Code available for payment source | Admitting: Physician Assistant

## 2023-02-08 VITALS — BP 100/60 | HR 52 | Temp 98.2°F | Ht 62.0 in | Wt 143.0 lb

## 2023-02-08 DIAGNOSIS — E559 Vitamin D deficiency, unspecified: Secondary | ICD-10-CM | POA: Diagnosis not present

## 2023-02-08 DIAGNOSIS — E663 Overweight: Secondary | ICD-10-CM

## 2023-02-08 DIAGNOSIS — H9202 Otalgia, left ear: Secondary | ICD-10-CM

## 2023-02-08 DIAGNOSIS — Z0001 Encounter for general adult medical examination with abnormal findings: Secondary | ICD-10-CM | POA: Diagnosis not present

## 2023-02-08 DIAGNOSIS — F32A Depression, unspecified: Secondary | ICD-10-CM | POA: Insufficient documentation

## 2023-02-08 NOTE — Progress Notes (Signed)
Subjective:    Jenny Lane is a 37 y.o. female and is here for a comprehensive physical exam.  HPI  Health Maintenance Due  Topic Date Due   Hepatitis C Screening  Never done    Acute Concerns: Rhinorrhea: She complains of rhionorrhea, itchy eyes, and left ear pain starting 2 weeks ago.  Previously would take Zyrtec Has not been using any nasal sprays, but used to use Flonase.  States her nasal discharge is clear.  Denies any fever or chills.   Chronic Issues: None.  Health Maintenance: Immunizations -- UTD Colonoscopy -- N/A Mammogram -- N/A PAP -- UTD 01/07/2022.  Bone Density -- N/A Diet -- She tends to eat large portions of carbohydrates (rice) Exercise -- She occasionally uses an elliptical at home - once in 3 weeks.   Sleep habits -- no concerns  Mood -- Stable  UTD with dentist? - yes UTD with eye doctor? - no, no concerns  Weight history: Wt Readings from Last 10 Encounters:  02/08/23 143 lb (64.9 kg)  11/02/22 146 lb (66.2 kg)  08/03/22 139 lb 6 oz (63.2 kg)  06/15/22 139 lb 6.4 oz (63.2 kg)  05/25/22 139 lb (63 kg)  04/29/22 136 lb 9.6 oz (62 kg)  04/23/22 136 lb (61.7 kg)  12/24/21 130 lb 12.8 oz (59.3 kg)  10/29/21 128 lb 8 oz (58.3 kg)  10/24/21 135 lb (61.2 kg)   Body mass index is 26.16 kg/m. Patient's last menstrual period was 01/15/2023 (exact date).  Alcohol use:  reports no history of alcohol use.  Tobacco use:  Tobacco Use: Low Risk  (02/08/2023)   Patient History    Smoking Tobacco Use: Never    Smokeless Tobacco Use: Never    Passive Exposure: Not on file   Eligible for lung cancer screening? no     02/08/2023    2:41 PM  Depression screen PHQ 2/9  Decreased Interest 0  Down, Depressed, Hopeless 2  PHQ - 2 Score 2  Altered sleeping 0  Tired, decreased energy 0  Change in appetite 2  Feeling bad or failure about yourself  1  Trouble concentrating 0  Moving slowly or fidgety/restless 3  Suicidal thoughts 0  PHQ-9 Score  8  Difficult doing work/chores Somewhat difficult     Other providers/specialists: Patient Care Team: Jarold Motto, [place] as PCP - General (Physician Assistant) O'Neal, Ronnald Ramp, MD as PCP - Cardiology (Internal Medicine) Sedalia Muta, PT as Physical Therapist (Physical Therapy) Zelphia Cairo, MD as Consulting Physician (Obstetrics and Gynecology)    PMHx, SurgHx, SocialHx, Medications, and Allergies were reviewed in the Visit Navigator and updated as appropriate.   Past Medical History:  Diagnosis Date   Depression    History of chicken pox    Tuberculosis    Received 9 Months of Treatement in 2006   Typhoid fever    Varicella    As a child      Past Surgical History:  Procedure Laterality Date   VAGINAL DELIVERY       Family History  Problem Relation Age of Onset   Diabetes Father    Cancer Neg Hx     Social History   Tobacco Use   Smoking status: Never   Smokeless tobacco: Never   Tobacco comments:    Never smoked  Vaping Use   Vaping Use: Never used  Substance Use Topics   Alcohol use: No   Drug use: No    Review of Systems:  Review of Systems  HENT:  Positive for ear pain (left).        (+) rhinorrhea  Eyes:        (+) itchy eyes    Objective:   BP 100/60 (BP Location: Left Arm, Patient Position: Sitting, Cuff Size: Normal)   Pulse (!) 52   Temp 98.2 F (36.8 C) (Temporal)   Ht 5\' 2"  (1.575 m)   Wt 143 lb (64.9 kg)   LMP 01/15/2023 (Exact Date)   SpO2 97%   BMI 26.16 kg/m  Body mass index is 26.16 kg/m.   General Appearance:    Alert, cooperative, no distress, appears stated age  Head:    Normocephalic, without obvious abnormality, atraumatic  Eyes:    PERRL, conjunctiva/corneas clear, EOM's intact, fundi    benign, both eyes  Ears:    Normal TM's and external ear canals, both ears  Nose:   Nares normal, septum midline, mucosa normal, no drainage    or sinus tenderness  Throat:   Lips, mucosa, and tongue normal;  teeth and gums normal  Neck:   Supple, symmetrical, trachea midline, no adenopathy;    thyroid:  no enlargement/tenderness/nodules; no carotid   bruit or JVD  Back:     Symmetric, no curvature, ROM normal, no CVA tenderness  Lungs:     Clear to auscultation bilaterally, respirations unlabored  Chest Wall:    No tenderness or deformity   Heart:    Regular rate and rhythm, S1 and S2 normal, no murmur, rub or gallop  Breast Exam:    Deferred  Abdomen:     Soft, non-tender, bowel sounds active all four quadrants,    no masses, no organomegaly  Genitalia:    Deferred  Extremities:   Extremities normal, atraumatic, no cyanosis or edema  Pulses:   2+ and symmetric all extremities  Skin:   Skin color, texture, turgor normal, no rashes or lesions  Lymph nodes:   Cervical, supraclavicular, and axillary nodes normal  Neurologic:   CNII-XII intact, normal strength, sensation and reflexes    throughout    Assessment/Plan:   Encounter for general adult medical examination with abnormal findings Today patient counseled on age appropriate routine health concerns for screening and prevention, each reviewed and up to date or declined. Immunizations reviewed and up to date or declined. Labs ordered and reviewed. Risk factors for depression reviewed and negative. Hearing function and visual acuity are intact. ADLs screened and addressed as needed. Functional ability and level of safety reviewed and appropriate. Education, counseling and referrals performed based on assessed risks today. Patient provided with a copy of personalized plan for preventive services.  Left ear pain No red flags on exam.  Suspect allergies. Recommend antihistamine and Flonase No indication for antibiotic(s) at this time Reviewed return precautions including worsening fever, SOB, worsening cough or other concerns. Push fluids and rest. I recommend that patient follow-up if symptoms worsen or persist despite treatment x 7-10 days,  sooner if needed.  Overweight Encouraged cho reduction and increase in exercise  Vitamin D deficiency Update vitamin D and provide recommendations accordingly   I,Rachel Rivera,acting as a scribe for [name], PA.,have documented all relevant documentation on the behalf of Jarold Motto, PA,as directed by  Jarold Motto, PA while in the presence of Jarold Motto, [place].  I, Jarold Motto, [place], have reviewed all documentation for this visit. The documentation on 02/08/23 for the exam, diagnosis, procedures, and orders are all accurate and complete.   Lelon Mast  Bufford Buttner, PA-C [facility] [place]

## 2023-02-08 NOTE — Patient Instructions (Addendum)
It was great to see you!  May use over the counter antihistamines such as Zyrtec (cetirizine), Claritin (loratadine), Allegra (fexofenadine), or Xyzal (levocetirizine) daily as needed. May take twice a day if needed as long as it does not cause drowsiness. May use Pazeo 1 drop in each eye daily as needed for itchy/watery eyes.  Continue Flonase 1 spray twice a day. Nasal saline spray (i.e., Simply Saline) or nasal saline lavage (i.e., NeilMed) is recommended as needed and prior to medicated nasal sprays.   Please go to the lab for blood work.   Our office will call you with your results unless you have chosen to receive results via MyChart.  If your blood work is normal we will follow-up each year for physicals and as scheduled for chronic medical problems.  If anything is abnormal we will treat accordingly and get you in for a follow-up.  Take care,  Lelon Mast

## 2023-02-09 ENCOUNTER — Other Ambulatory Visit: Payer: Self-pay | Admitting: Physician Assistant

## 2023-02-09 LAB — CBC WITH DIFFERENTIAL/PLATELET
Basophils Absolute: 0.1 10*3/uL (ref 0.0–0.1)
Basophils Relative: 0.6 % (ref 0.0–3.0)
Eosinophils Absolute: 0.4 10*3/uL (ref 0.0–0.7)
Eosinophils Relative: 3.9 % (ref 0.0–5.0)
HCT: 36.8 % (ref 36.0–46.0)
Hemoglobin: 12.2 g/dL (ref 12.0–15.0)
Lymphocytes Relative: 21.2 % (ref 12.0–46.0)
Lymphs Abs: 2 10*3/uL (ref 0.7–4.0)
MCHC: 33.1 g/dL (ref 30.0–36.0)
MCV: 88.5 fl (ref 78.0–100.0)
Monocytes Absolute: 0.7 10*3/uL (ref 0.1–1.0)
Monocytes Relative: 7.3 % (ref 3.0–12.0)
Neutro Abs: 6.4 10*3/uL (ref 1.4–7.7)
Neutrophils Relative %: 67 % (ref 43.0–77.0)
Platelets: 299 10*3/uL (ref 150.0–400.0)
RBC: 4.15 Mil/uL (ref 3.87–5.11)
RDW: 13.9 % (ref 11.5–15.5)
WBC: 9.5 10*3/uL (ref 4.0–10.5)

## 2023-02-09 LAB — COMPREHENSIVE METABOLIC PANEL
ALT: 9 U/L (ref 0–35)
AST: 12 U/L (ref 0–37)
Albumin: 4.2 g/dL (ref 3.5–5.2)
Alkaline Phosphatase: 46 U/L (ref 39–117)
BUN: 15 mg/dL (ref 6–23)
CO2: 29 mEq/L (ref 19–32)
Calcium: 9.1 mg/dL (ref 8.4–10.5)
Chloride: 103 mEq/L (ref 96–112)
Creatinine, Ser: 0.83 mg/dL (ref 0.40–1.20)
GFR: 89.7 mL/min (ref >60.00)
Glucose, Bld: 97 mg/dL (ref 70–99)
Potassium: 4.7 mEq/L (ref 3.5–5.1)
Sodium: 136 mEq/L (ref 135–145)
Total Bilirubin: 0.3 mg/dL (ref 0.2–1.2)
Total Protein: 7.1 g/dL (ref 6.0–8.3)

## 2023-02-09 LAB — TSH: TSH: 0.64 u[IU]/mL (ref 0.35–5.50)

## 2023-02-09 LAB — LIPID PANEL
Cholesterol: 145 mg/dL (ref 0–200)
HDL: 45.9 mg/dL (ref >39.00)
LDL Cholesterol: 85 mg/dL (ref 0–99)
NonHDL: 99.27
Total CHOL/HDL Ratio: 3
Triglycerides: 71 mg/dL (ref 0.0–149.0)
VLDL: 14.2 mg/dL (ref 0.0–40.0)

## 2023-02-09 LAB — VITAMIN D 25 HYDROXY (VIT D DEFICIENCY, FRACTURES): VITD: 14.35 ng/mL — ABNORMAL LOW (ref 30.00–100.00)

## 2023-02-09 LAB — HEMOGLOBIN A1C: Hgb A1c MFr Bld: 5.5 % (ref 4.6–6.5)

## 2023-02-09 MED ORDER — VITAMIN D (ERGOCALCIFEROL) 1.25 MG (50000 UNIT) PO CAPS: 50000.0000 [IU] | ORAL_CAPSULE | ORAL | 0 refills | Status: DC

## 2023-02-12 ENCOUNTER — Encounter: Payer: Self-pay | Admitting: Physician Assistant

## 2023-02-19 ENCOUNTER — Telehealth: Payer: No Typology Code available for payment source | Admitting: Physician Assistant

## 2023-02-19 DIAGNOSIS — J019 Acute sinusitis, unspecified: Secondary | ICD-10-CM | POA: Diagnosis not present

## 2023-02-19 DIAGNOSIS — B9689 Other specified bacterial agents as the cause of diseases classified elsewhere: Secondary | ICD-10-CM

## 2023-02-19 MED ORDER — FLUTICASONE PROPIONATE 50 MCG/ACT NA SUSP
2.0000 | Freq: Every day | NASAL | 0 refills | Status: DC
Start: 2023-02-19 — End: 2023-11-02

## 2023-02-19 MED ORDER — AMOXICILLIN-POT CLAVULANATE 875-125 MG PO TABS
1.0000 | ORAL_TABLET | Freq: Two times a day (BID) | ORAL | 0 refills | Status: DC
Start: 2023-02-19 — End: 2023-03-10

## 2023-02-19 MED ORDER — PREDNISONE 20 MG PO TABS
40.0000 mg | ORAL_TABLET | Freq: Every day | ORAL | 0 refills | Status: DC
Start: 2023-02-19 — End: 2023-03-10

## 2023-02-19 NOTE — Progress Notes (Signed)
Virtual Visit Consent   [name], you are scheduled for a virtual visit with a [facility] provider today. Just as with appointments in the office, your consent must be obtained to participate. Your consent will be active for this visit and any virtual visit you may have with one of our providers in the next 365 days. If you have a MyChart account, a copy of this consent can be sent to you electronically.  As this is a virtual visit, video technology does not allow for your provider to perform a traditional examination. This may limit your provider's ability to fully assess your condition. If your provider identifies any concerns that need to be evaluated in person or the need to arrange testing (such as labs, EKG, etc.), we will make arrangements to do so. Although advances in technology are sophisticated, we cannot ensure that it will always work on either your end or our end. If the connection with a video visit is poor, the visit may have to be switched to a telephone visit. With either a video or telephone visit, we are not always able to ensure that we have a secure connection.  By engaging in this virtual visit, you consent to the provision of healthcare and authorize for your insurance to be billed (if applicable) for the services provided during this visit. Depending on your insurance coverage, you may receive a charge related to this service.  I need to obtain your verbal consent now. Are you willing to proceed with your visit today? [name] has provided verbal consent on 02/19/2023 for a virtual visit (video or telephone). [name], [place]  Date: 02/19/2023 9:45 AM  Virtual Visit via Video Note   I, [name], connected with  Jenny Lane  ([id number], 09/24/1985) on 02/19/23 at  9:45 AM EDT by a video-enabled telemedicine application and verified that I am speaking with the correct person using two identifiers.  Location: Patient: Virtual Visit Location Patient:  Home Provider: Virtual Visit Location Provider: Home Office   I discussed the limitations of evaluation and management by telemedicine and the availability of in person appointments. The patient expressed understanding and agreed to proceed.    History of Present Illness: Jenny Lane is a 37 y.o. who identifies as a female who was assigned female at birth, and is being seen today for ongoing nasal congestion, head congestion with thick green mucous for > 7 days. Now noting increased congestion and sinus pain. Denies fever, chills or aches. Some occasional cough. Denies chest pain or SOB.  HPI: HPI  Problems:  Patient Active Problem List   Diagnosis Date Noted   Depressive disorder 02/08/2023   Fatty liver disease, nonalcoholic 08/02/2021   Dermatographism 12/22/2019   Tuberculosis 12/22/2019   GERD (gastroesophageal reflux disease) 10/26/2019    Allergies:  Allergies  Allergen Reactions   Biaxin [Clarithromycin] Anaphylaxis   Cefdinir Hives    Sneezing, swollen lips, and hives.  Last taken 2 months ago for a sinus infection.    Clindamycin/Lincomycin Hives    Patient experienced low back pain, hives, and a dark line around her bottom lip when taking medication.    Sulfa Antibiotics Hives and Swelling    Lips Swell.  Last taken during childhood.    Tetracyclines & Related     Childhood reaction   Medications:  Current Outpatient Medications:    propranolol (INDERAL) 10 MG tablet, Take 20 mg by mouth 3 (three) times daily., Disp: , Rfl:  Vitamin D, Ergocalciferol, (DRISDOL) 1.25 MG (50000 UNIT) CAPS capsule, Take 1 capsule (50,000 Units total) by mouth every 7 (seven) days., Disp: 12 capsule, Rfl: 0   ziprasidone (GEODON) 60 MG capsule, Take 60 mg by mouth., Disp: , Rfl:   Observations/Objective: Patient is well-developed, well-nourished in no acute distress.  Resting comfortably at home.  Head is normocephalic, atraumatic.  No labored breathing. Speech is clear and  coherent with logical content.  Patient is alert and oriented at baseline.   Assessment and Plan: 1. Acute bacterial sinusitis  Rx Augmentin ( notes tolerates penicillins)  Increase fluids.  Rest.  Saline nasal spray.  Probiotic.  Mucinex as directed.  Humidifier in bedroom. Flonase and prednisone per orders.  Call or return to clinic if symptoms are not improving.   Follow Up Instructions: I discussed the assessment and treatment plan with the patient. The patient was provided an opportunity to ask questions and all were answered. The patient agreed with the plan and demonstrated an understanding of the instructions.  A copy of instructions were sent to the patient via MyChart unless otherwise noted below.   The patient was advised to call back or seek an in-person evaluation if the symptoms worsen or if the condition fails to improve as anticipated.  Time:  I spent 10 minutes with the patient via telehealth technology discussing the above problems/concerns.    [name], PA-C

## 2023-02-19 NOTE — Patient Instructions (Signed)
Jenny Lane, thank you for joining [name], PA-C for today's virtual visit.  While this provider is not your primary care provider (PCP), if your PCP is located in our provider database this encounter information will be shared with them immediately following your visit.   A [facility] MyChart account gives you access to today's visit and all your visits, tests, and labs performed at [facility] " click here if you don't have a [facility] MyChart account or go to [email or web address]  Consent: (Patient) Jenny Lane provided verbal consent for this virtual visit at the beginning of the encounter.  Current Medications:  Current Outpatient Medications:    propranolol (INDERAL) 10 MG tablet, Take 20 mg by mouth 3 (three) times daily., Disp: , Rfl:    Vitamin D, Ergocalciferol, (DRISDOL) 1.25 MG (50000 UNIT) CAPS capsule, Take 1 capsule (50,000 Units total) by mouth every 7 (seven) days., Disp: 12 capsule, Rfl: 0   ziprasidone (GEODON) 20 MG capsule, SMARTSIG:1 Capsule(s) By Mouth Every Evening, Disp: , Rfl:    Medications ordered in this encounter:  No orders of the defined types were placed in this encounter.    *If you need refills on other medications prior to your next appointment, please contact your pharmacy*  Follow-Up: Call back or seek an in-person evaluation if the symptoms worsen or if the condition fails to improve as anticipated.  [facility] [phone]  Other Instructions Please take antibiotic as directed.  Increase fluid intake.  Use Saline nasal spray.  Take a daily multivitamin. Use the Flonase and prednisone as directed.  Place a humidifier in the bedroom.  Please call or return clinic if symptoms are not improving.  Sinusitis Sinusitis is redness, soreness, and swelling (inflammation) of the paranasal sinuses. Paranasal sinuses are air pockets within the bones of your face (beneath the eyes, the middle of the forehead,  or above the eyes). In healthy paranasal sinuses, mucus is able to drain out, and air is able to circulate through them by way of your nose. However, when your paranasal sinuses are inflamed, mucus and air can become trapped. This can allow bacteria and other germs to grow and cause infection. Sinusitis can develop quickly and last only a short time (acute) or continue over a long period (chronic). Sinusitis that lasts for more than 12 weeks is considered chronic.  CAUSES  Causes of sinusitis include: Allergies. Structural abnormalities, such as displacement of the cartilage that separates your nostrils (deviated septum), which can decrease the air flow through your nose and sinuses and affect sinus drainage. Functional abnormalities, such as when the small hairs (cilia) that line your sinuses and help remove mucus do not work properly or are not present. SYMPTOMS  Symptoms of acute and chronic sinusitis are the same. The primary symptoms are pain and pressure around the affected sinuses. Other symptoms include: Upper toothache. Earache. Headache. Bad breath. Decreased sense of smell and taste. A cough, which worsens when you are lying flat. Fatigue. Fever. Thick drainage from your nose, which often is green and may contain pus (purulent). Swelling and warmth over the affected sinuses. DIAGNOSIS  Your caregiver will perform a physical exam. During the exam, your caregiver may: Look in your nose for signs of abnormal growths in your nostrils (nasal polyps). Tap over the affected sinus to check for signs of infection. View the inside of your sinuses (endoscopy) with a special imaging device with a light attached (endoscope), which is inserted into your sinuses.  If your caregiver suspects that you have chronic sinusitis, one or more of the following tests may be recommended: Allergy tests. Nasal culture A sample of mucus is taken from your nose and sent to a lab and screened for  bacteria. Nasal cytology A sample of mucus is taken from your nose and examined by your caregiver to determine if your sinusitis is related to an allergy. TREATMENT  Most cases of acute sinusitis are related to a viral infection and will resolve on their own within 10 days. Sometimes medicines are prescribed to help relieve symptoms (pain medicine, decongestants, nasal steroid sprays, or saline sprays).  However, for sinusitis related to a bacterial infection, your caregiver will prescribe antibiotic medicines. These are medicines that will help kill the bacteria causing the infection.  Rarely, sinusitis is caused by a fungal infection. In theses cases, your caregiver will prescribe antifungal medicine. For some cases of chronic sinusitis, surgery is needed. Generally, these are cases in which sinusitis recurs more than 3 times per year, despite other treatments. HOME CARE INSTRUCTIONS  Drink plenty of water. Water helps thin the mucus so your sinuses can drain more easily. Use a humidifier. Inhale steam 3 to 4 times a day (for example, sit in the bathroom with the shower running). Apply a warm, moist washcloth to your face 3 to 4 times a day, or as directed by your caregiver. Use saline nasal sprays to help moisten and clean your sinuses. Take over-the-counter or prescription medicines for pain, discomfort, or fever only as directed by your caregiver. SEEK IMMEDIATE MEDICAL CARE IF: You have increasing pain or severe headaches. You have nausea, vomiting, or drowsiness. You have swelling around your face. You have vision problems. You have a stiff neck. You have difficulty breathing. MAKE SURE YOU:  Understand these instructions. Will watch your condition. Will get help right away if you are not doing well or get worse. Document Released: 08/24/2005 Document Revised: 11/16/2011 Document Reviewed: 09/08/2011 [facility] Patient Information 2014 [place], [place].    If you have been  instructed to have an in-person evaluation today at a local Urgent Care facility, please use the link below. It will take you to a list of all of our available [facility], including address, phone number and hours of operation. Please do not delay care.  [facility]  If you or a family member do not have a primary care provider, use the link below to schedule a visit and establish care. When you choose a [facility] primary care physician or advanced practice provider, you gain a long-term partner in health. Find a Primary Care Provider  Learn more about [facility]'s in-office and virtual care options: [facility] - Get Care Now

## 2023-03-10 ENCOUNTER — Encounter: Payer: Self-pay | Admitting: Physician Assistant

## 2023-03-10 ENCOUNTER — Telehealth: Payer: No Typology Code available for payment source | Admitting: Physician Assistant

## 2023-03-10 VITALS — Ht 62.0 in | Wt 143.0 lb

## 2023-03-10 DIAGNOSIS — U071 COVID-19: Secondary | ICD-10-CM

## 2023-03-10 NOTE — Progress Notes (Signed)
Virtual Visit via Video   I connected with Jenny Lane on 03/10/23 at  1:40 PM EDT by a video enabled telemedicine application and verified that I am speaking with the correct person using two identifiers. Location patient: Home Location provider: [facility], Office Persons participating in the virtual visit: Jenny Lane, Jarold Motto PA-C, Corky Mull, LPN   I discussed the limitations of evaluation and management by telemedicine and the availability of in person appointments. The patient expressed understanding and agreed to proceed.  I acted as a Neurosurgeon for [name], [employer], LPN   Subjective:   HPI:   COVID-19 Positive Symptom onset: yesterday 03/09/2023 Travel/contacts: exposed to husband, positive 5 days ago  Vaccination status: 3 vaccines Testing results: positive COVID test yesterday.  Patient endorses the following symptoms: Nasal congestion, slight headache, slight cough, some SOB  Patient denies the following symptoms: Denies chest pain.  Treatments tried: Tylenol, Tessalon Pearles, Flonase  ROS: See pertinent positives and negatives per HPI.  Patient Active Problem List   Diagnosis Date Noted   Depressive disorder 02/08/2023   Fatty liver disease, nonalcoholic 08/02/2021   Dermatographism 12/22/2019   Tuberculosis 12/22/2019   GERD (gastroesophageal reflux disease) 10/26/2019    Social History   Tobacco Use   Smoking status: Never   Smokeless tobacco: Never   Tobacco comments:    Never smoked  Substance Use Topics   Alcohol use: No    Current Outpatient Medications:    fluticasone (FLONASE) 50 MCG/ACT nasal spray, Place 2 sprays into both nostrils daily., Disp: 16 g, Rfl: 0   propranolol (INDERAL) 10 MG tablet, Take 20 mg by mouth 3 (three) times daily., Disp: , Rfl:    Vitamin D, Ergocalciferol, (DRISDOL) 1.25 MG (50000 UNIT) CAPS capsule, Take 1 capsule (50,000 Units total) by mouth every 7 (seven) days., Disp: 12  capsule, Rfl: 0   ziprasidone (GEODON) 20 MG capsule, Take 20 mg by mouth 2 (two) times daily., Disp: , Rfl:   Allergies  Allergen Reactions   Biaxin [Clarithromycin] Anaphylaxis   Cefdinir Hives    Sneezing, swollen lips, and hives.  Last taken 2 months ago for a sinus infection.    Clindamycin/Lincomycin Hives    Patient experienced low back pain, hives, and a dark line around her bottom lip when taking medication.    Sulfa Antibiotics Hives and Swelling    Lips Swell.  Last taken during childhood.    Tetracyclines & Related     Childhood reaction    Objective:   VITALS: Per patient if applicable, see vitals. GENERAL: Alert, appears well and in no acute distress. HEENT: Atraumatic, conjunctiva clear, no obvious abnormalities on inspection of external nose and ears. NECK: Normal movements of the head and neck. CARDIOPULMONARY: No increased WOB. Speaking in clear sentences. I:E ratio WNL.  MS: Moves all visible extremities without noticeable abnormality. PSYCH: Pleasant and cooperative, well-groomed. Speech normal rate and rhythm. Affect is appropriate. Insight and judgement are appropriate. Attention is focused, linear, and appropriate.  NEURO: CN grossly intact. Oriented as arrived to appointment on time with no prompting. Moves both UE equally.  SKIN: No obvious lesions, wounds, erythema, or cyanosis noted on face or hands.  Assessment and Plan:   Zykera was seen today for covid positive.  Diagnoses and all orders for this visit:  COVID-19    No red flags on discussion, patient is not in any obvious distress during our visit. Discussed progression of most viral illnesses, and recommended  supportive care at this point in time.  Briefly discussed taking Paxlovid however there is an interaction with this medication and her geodon - we will no prescribe this at this time. Discussed over the counter supportive care options, including Tylenol 500 mg q 8 hours, with  recommendations to push fluids and rest. Reviewed return precautions including new/worsening fever, SOB, new/worsening cough, sudden onset changes of symptoms. Recommended need to self-quarantine and practice social distancing until symptoms resolve. I recommend that patient follow-up if symptoms worsen or persist despite treatment x 7-10 days, sooner if needed.  I discussed the assessment and treatment plan with the patient. The patient was provided an opportunity to ask questions and all were answered. The patient agreed with the plan and demonstrated an understanding of the instructions.   The patient was advised to call back or seek an in-person evaluation if the symptoms worsen or if the condition fails to improve as anticipated.   [employer], [place] 03/10/2023

## 2023-03-11 ENCOUNTER — Encounter: Payer: Self-pay | Admitting: Physician Assistant

## 2023-03-12 NOTE — Telephone Encounter (Signed)
Please advise 

## 2023-07-16 ENCOUNTER — Encounter: Payer: Self-pay | Admitting: Physician Assistant

## 2023-08-13 ENCOUNTER — Encounter: Payer: Self-pay | Admitting: Physician Assistant

## 2023-08-13 ENCOUNTER — Ambulatory Visit (INDEPENDENT_AMBULATORY_CARE_PROVIDER_SITE_OTHER): Payer: No Typology Code available for payment source | Admitting: Physician Assistant

## 2023-08-13 VITALS — BP 116/72 | HR 80 | Temp 97.7°F | Ht 62.0 in | Wt 136.2 lb

## 2023-08-13 DIAGNOSIS — R5383 Other fatigue: Secondary | ICD-10-CM | POA: Diagnosis not present

## 2023-08-13 DIAGNOSIS — R1084 Generalized abdominal pain: Secondary | ICD-10-CM

## 2023-08-13 DIAGNOSIS — Z23 Encounter for immunization: Secondary | ICD-10-CM | POA: Diagnosis not present

## 2023-08-13 DIAGNOSIS — F32A Depression, unspecified: Secondary | ICD-10-CM

## 2023-08-13 DIAGNOSIS — E559 Vitamin D deficiency, unspecified: Secondary | ICD-10-CM | POA: Diagnosis not present

## 2023-08-13 NOTE — Patient Instructions (Signed)
It was great to see you!  Try the gas-x for your symptoms If no improvement, let me know  Please continue to work closely with your psychiatrist to find medication that works for you  We will be in touch with blood work  Take care,  Jarold Motto PA-C

## 2023-08-13 NOTE — Progress Notes (Signed)
Jenny Lane is a 37 y.o. female here for a follow-up of a pre-existing problem.  History of Present Illness:   Chief Complaint  Patient presents with   Follow-up    Follow up on iron and vitamin d    Abdominal Pain    Sharp pain started this morning, frequent gas and urge to "poop" without going    HPI  Vitamin-D & Iron: Presents to have blood work done to measure her vitamin/mineral levels as to determine to any deficiencies are contributing to her depression/mental fatigue.   Depression: Reports that she has stopped her Geodon, and most recently, Zoloft as she feels her mental symptoms may be attributed to a vitamin/mineral deficiency.  Planning to follow-up with psychiatry soon   Abdominal Pain (Left): Complains of sharp left abdominal pain occurring this morning. Has since resolved. Endorses bloating, excessive gas, and bowel urgency without a movement for the past 2-3 months.   A similar issue was discussed in February 2024, and was recommended to try GasX to relieve this. Reports that GasX did relieve that problem, but she has not tried it for this issue.  Denies constipation (has BM daily), straining, loose or watery/ melena stools, or concern for pregnancy.   Past Medical History:  Diagnosis Date   Depression    History of chicken pox    Tuberculosis    Received 9 Months of Treatement in 2006   Typhoid fever    Varicella    As a child     Social History   Tobacco Use   Smoking status: Never   Smokeless tobacco: Never   Tobacco comments:    Never smoked  Vaping Use   Vaping status: Never Used  Substance Use Topics   Alcohol use: No   Drug use: No   Past Surgical History:  Procedure Laterality Date   VAGINAL DELIVERY     Family History  Problem Relation Age of Onset   Diabetes Father    Cancer Neg Hx    Allergies  Allergen Reactions   Biaxin [Clarithromycin] Anaphylaxis   Cefdinir Hives    Sneezing, swollen lips, and hives.  Last taken 2  months ago for a sinus infection.    Clindamycin/Lincomycin Hives    Patient experienced low back pain, hives, and a dark line around her bottom lip when taking medication.    Sulfa Antibiotics Hives and Swelling    Lips Swell.  Last taken during childhood.    Tetracyclines & Related     Childhood reaction   Current Medications:   Current Outpatient Medications:    fluticasone (FLONASE) 50 MCG/ACT nasal spray, Place 2 sprays into both nostrils daily., Disp: 16 g, Rfl: 0  Review of Systems:   ROS See pertinent positives and negatives as per the HPI.  Vitals:   Vitals:   08/13/23 1434  BP: 116/72  Pulse: 80  Temp: 97.7 F (36.5 C)  TempSrc: Temporal  SpO2: 99%  Weight: 136 lb 3.2 oz (61.8 kg)  Height: 5\' 2"  (1.575 m)     Body mass index is 24.91 kg/m.  Physical Exam:   Physical Exam Vitals and nursing note reviewed.  Constitutional:      General: She is not in acute distress.    Appearance: She is well-developed. She is not ill-appearing or toxic-appearing.  Cardiovascular:     Rate and Rhythm: Normal rate and regular rhythm.     Pulses: Normal pulses.     Heart sounds: Normal heart sounds, S1  normal and S2 normal.  Pulmonary:     Effort: Pulmonary effort is normal.     Breath sounds: Normal breath sounds.  Abdominal:     General: Abdomen is flat. Bowel sounds are normal.     Palpations: Abdomen is soft.     Tenderness: There is no abdominal tenderness.  Skin:    General: Skin is warm and dry.  Neurological:     Mental Status: She is alert.     GCS: GCS eye subscore is 4. GCS verbal subscore is 5. GCS motor subscore is 6.  Psychiatric:        Speech: Speech normal.        Behavior: Behavior normal. Behavior is cooperative.     Assessment and Plan:   Depressive disorder No red flags Denies suicidal ideation/hi Continue close follow-up with psychiatry Will update blood work to rule out organic cause of symptom(s)   Vitamin D deficiency Update vitamin  D and provide recommendations  Fatigue, unspecified type Update blood work to rule out organic cause of symptom(s)  Continue efforts at healthy lifestyle  Generalized abdominal pain No red flags No evidence of acute abdomen needing urgent imaging Has responded well to gas-x in the past -- recommend that she re-start this to see if it helps her symptom(s)  Consider omeprazole for gastroesophageal reflux disease symptom(s)   Need for influenza vaccination Completed today   I,Emily Lagle,acting as a scribe for [name], PA.,have documented all relevant documentation on the behalf of Jarold Motto, PA,as directed by  Jarold Motto, PA while in the presence of Jarold Motto, [place].  I, Jarold Motto, [place], have reviewed all documentation for this visit. The documentation on 08/13/23 for the exam, diagnosis, procedures, and orders are all accurate and complete.  Jarold Motto, PA-C

## 2023-08-14 LAB — CBC WITH DIFFERENTIAL/PLATELET
Absolute Lymphocytes: 1968 {cells}/uL (ref 850–3900)
Absolute Monocytes: 559 {cells}/uL (ref 200–950)
Basophils Absolute: 16 {cells}/uL (ref 0–200)
Basophils Relative: 0.2 %
Eosinophils Absolute: 267 {cells}/uL (ref 15–500)
Eosinophils Relative: 3.3 %
HCT: 33.3 % — ABNORMAL LOW (ref 35.0–45.0)
Hemoglobin: 11.1 g/dL — ABNORMAL LOW (ref 11.7–15.5)
MCH: 29.4 pg (ref 27.0–33.0)
MCHC: 33.3 g/dL (ref 32.0–36.0)
MCV: 88.1 fL (ref 80.0–100.0)
MPV: 10.9 fL (ref 7.5–12.5)
Monocytes Relative: 6.9 %
Neutro Abs: 5289 {cells}/uL (ref 1500–7800)
Neutrophils Relative %: 65.3 %
Platelets: 329 10*3/uL (ref 140–400)
RBC: 3.78 10*6/uL — ABNORMAL LOW (ref 3.80–5.10)
RDW: 13.2 % (ref 11.0–15.0)
Total Lymphocyte: 24.3 %
WBC: 8.1 10*3/uL (ref 3.8–10.8)

## 2023-08-14 LAB — COMPREHENSIVE METABOLIC PANEL
AG Ratio: 1.5 (calc) (ref 1.0–2.5)
ALT: 11 U/L (ref 6–29)
AST: 13 U/L (ref 10–30)
Albumin: 4.1 g/dL (ref 3.6–5.1)
Alkaline phosphatase (APISO): 46 U/L (ref 31–125)
BUN: 14 mg/dL (ref 7–25)
CO2: 26 mmol/L (ref 20–32)
Calcium: 9.2 mg/dL (ref 8.6–10.2)
Chloride: 105 mmol/L (ref 98–110)
Creat: 0.65 mg/dL (ref 0.50–0.97)
Globulin: 2.8 g/dL (ref 1.9–3.7)
Glucose, Bld: 89 mg/dL (ref 65–99)
Potassium: 3.8 mmol/L (ref 3.5–5.3)
Sodium: 137 mmol/L (ref 135–146)
Total Bilirubin: 0.3 mg/dL (ref 0.2–1.2)
Total Protein: 6.9 g/dL (ref 6.1–8.1)

## 2023-08-14 LAB — HEMOGLOBIN A1C
Hgb A1c MFr Bld: 5.5 %{Hb} (ref <5.7)
Mean Plasma Glucose: 111 mg/dL
eAG (mmol/L): 6.2 mmol/L

## 2023-08-14 LAB — VITAMIN B12: Vitamin B-12: 287 pg/mL (ref 200–1100)

## 2023-08-14 LAB — TSH: TSH: 0.72 m[IU]/L

## 2023-08-14 LAB — VITAMIN D 25 HYDROXY (VIT D DEFICIENCY, FRACTURES): Vit D, 25-Hydroxy: 30 ng/mL (ref 30–100)

## 2023-08-18 ENCOUNTER — Ambulatory Visit: Payer: No Typology Code available for payment source | Admitting: Physician Assistant

## 2023-08-18 ENCOUNTER — Encounter: Payer: Self-pay | Admitting: Physician Assistant

## 2023-08-18 NOTE — Progress Notes (Addendum)
Jenny Lane is a 37 y.o. female here for a new problem.  History of Present Illness:   Chief Complaint  Patient presents with   vaginal irritation    Pt c/o vaginal irritation and odor,started on Sunday, now just odor, no vaginal discharge.    Back Pain    Pt c/o low back pain x 1 month off and on, has not tried any medication.    HPI  Possible BV/UTI Reports experiencing vaginal odor since 08/22/23. Had some dysuria on 08/22/23 that resolved the next day. Had a UTI about 6 months ago with dysuria. This also presented with vaginal odor. Patient's last menstrual period was 08/17/2023 (exact date).  Back Pain Has been having lower back pain since lifting heavy bottles one month ago. No change in severity of pain. She is not taking anything for pain. Denies numbness/tingling/weakness in legs, incontinence.  Past Medical History:  Diagnosis Date   Depression    History of chicken pox    Tuberculosis    Received 9 Months of Treatement in 2006   Typhoid fever    Varicella    As a child      Social History   Tobacco Use   Smoking status: Never   Smokeless tobacco: Never   Tobacco comments:    Never smoked  Vaping Use   Vaping status: Never Used  Substance Use Topics   Alcohol use: No   Drug use: No    Past Surgical History:  Procedure Laterality Date   VAGINAL DELIVERY      Family History  Problem Relation Age of Onset   Diabetes Father    Cancer Neg Hx     Allergies  Allergen Reactions   Biaxin [Clarithromycin] Anaphylaxis   Cefdinir Hives    Sneezing, swollen lips, and hives.  Last taken 2 months ago for a sinus infection.    Clindamycin/Lincomycin Hives    Patient experienced low back pain, hives, and a dark line around her bottom lip when taking medication.    Sulfa Antibiotics Hives and Swelling    Lips Swell.  Last taken during childhood.    Tetracyclines & Related     Childhood reaction    Current Medications:   Current Outpatient  Medications:    Cyanocobalamin (VITAMIN B-12) 5000 MCG TBDP, Take 1 tablet by mouth daily in the afternoon., Disp: , Rfl:    fluticasone (FLONASE) 50 MCG/ACT nasal spray, Place 2 sprays into both nostrils daily., Disp: 16 g, Rfl: 0   Prenatal Vit-Fe Fumarate-FA (PRENATAL VITAMINS) 27-0.8 MG TABS, Take 1 tablet by mouth daily in the afternoon., Disp: , Rfl:    Review of Systems:   Review of Systems  Constitutional:  Negative for fever and malaise/fatigue.  HENT:  Negative for congestion.   Eyes:  Negative for blurred vision.  Respiratory:  Negative for cough and shortness of breath.   Cardiovascular:  Negative for chest pain, palpitations and leg swelling.  Gastrointestinal:  Negative for vomiting.  Genitourinary:  Positive for dysuria.       (+) Vaginal odor  Musculoskeletal:  Positive for back pain.  Skin:  Negative for rash.  Neurological:  Negative for loss of consciousness and headaches.    Vitals:   Vitals:   08/25/23 1101  BP: 100/60  Pulse: 84  Temp: 98.2 F (36.8 C)  TempSrc: Temporal  SpO2: 97%  Weight: 136 lb 6.1 oz (61.9 kg)  Height: 5\' 2"  (1.575 m)     Body mass index is  24.94 kg/m.  Physical Exam:   Physical Exam Vitals and nursing note reviewed. Exam conducted with a chaperone present.  Constitutional:      General: She is not in acute distress.    Appearance: She is well-developed. She is not ill-appearing or toxic-appearing.  Cardiovascular:     Rate and Rhythm: Normal rate and regular rhythm.     Pulses: Normal pulses.     Heart sounds: Normal heart sounds, S1 normal and S2 normal.  Pulmonary:     Effort: Pulmonary effort is normal.     Breath sounds: Normal breath sounds.  Genitourinary:    Labia:        Right: No rash or tenderness.        Left: No rash or tenderness.      Vagina: Normal.     Cervix: Normal.  Musculoskeletal:     Comments: No decreased ROM 2/2 pain with flexion/extension, lateral side bends, or rotation. Slight tenderness  to palpation to lumbar spine. No evidence of erythema, rash or ecchymosis.   Skin:    General: Skin is warm and dry.  Neurological:     Mental Status: She is alert.     GCS: GCS eye subscore is 4. GCS verbal subscore is 5. GCS motor subscore is 6.  Psychiatric:        Speech: Speech normal.        Behavior: Behavior normal. Behavior is cooperative.    Results for orders placed or performed in visit on 08/25/23  POCT Urinalysis Dipstick (Automated)  Result Value Ref Range   Color, UA yellow    Clarity, UA cloudy    Glucose, UA Negative Negative   Bilirubin, UA Negative    Ketones, UA Negative    Spec Grav, UA 1.015 1.010 - 1.025   Blood, UA Negative    pH, UA 7.5 5.0 - 8.0   Protein, UA Negative Negative   Urobilinogen, UA 0.2 0.2 or 1.0 E.U./dL   Nitrite, UA Negative    Leukocytes, UA Negative Negative     Assessment and Plan:   Vaginal odor UA unremarkable however will go ahead and send off for culture given her history Vaginal swab obtained -- will add treatment(s) based on symptom(s)  Follow-up based on results  Acute midline low back pain without sciatica No red flags Recommend trying anti-inflammatories for symptom(s) -- such as ibuprofen 200-400 mg three times daily with food If no improvement or any new/worsening symptom(s), will have her reach out and we will order xray   I,Alexander Ruley,acting as a scribe for [name], PA.,have documented all relevant documentation on the behalf of Jarold Motto, PA,as directed by  Jarold Motto, PA while in the presence of Jarold Motto, [place].  I, Jarold Motto, [place], have reviewed all documentation for this visit. The documentation on 08/25/23 for the exam, diagnosis, procedures, and orders are all accurate and complete.   Jarold Motto, PA-C

## 2023-08-19 ENCOUNTER — Encounter: Payer: Self-pay | Admitting: Physician Assistant

## 2023-08-19 NOTE — Telephone Encounter (Signed)
Hello, can you help with this. Thanks

## 2023-08-25 ENCOUNTER — Other Ambulatory Visit (HOSPITAL_COMMUNITY)
Admission: RE | Admit: 2023-08-25 | Discharge: 2023-08-25 | Disposition: A | Discharge status: Home / Self Care | Payer: No Typology Code available for payment source | Source: Ambulatory Visit | Attending: Physician Assistant | Admitting: Physician Assistant

## 2023-08-25 ENCOUNTER — Encounter: Payer: Self-pay | Admitting: Physician Assistant

## 2023-08-25 ENCOUNTER — Ambulatory Visit: Payer: No Typology Code available for payment source | Admitting: Physician Assistant

## 2023-08-25 VITALS — BP 100/60 | HR 84 | Temp 98.2°F | Ht 62.0 in | Wt 136.4 lb

## 2023-08-25 DIAGNOSIS — N898 Other specified noninflammatory disorders of vagina: Secondary | ICD-10-CM | POA: Insufficient documentation

## 2023-08-25 DIAGNOSIS — M545 Low back pain, unspecified: Secondary | ICD-10-CM

## 2023-08-25 LAB — POC URINALSYSI DIPSTICK (AUTOMATED)
Bilirubin, UA: NEGATIVE
Blood, UA: NEGATIVE
Glucose, UA: NEGATIVE
Ketones, UA: NEGATIVE
Leukocytes, UA: NEGATIVE
Nitrite, UA: NEGATIVE
Protein, UA: NEGATIVE
Spec Grav, UA: 1.015 (ref 1.010–1.025)
Urobilinogen, UA: 0.2 U/dL
pH, UA: 7.5 (ref 5.0–8.0)

## 2023-08-25 NOTE — Patient Instructions (Signed)
It was great to see you!  Take 200-400 mg ibuprofen every 6-8 hours with food for your back pain If no improvement, please let me know and we will get xray  I will be in touch with all results  Take care,  Jarold Motto PA-C

## 2023-08-26 LAB — CERVICOVAGINAL ANCILLARY ONLY
Bacterial Vaginitis (gardnerella): NEGATIVE
Candida Glabrata: NEGATIVE
Candida Vaginitis: NEGATIVE
Comment: NEGATIVE
Comment: NEGATIVE
Comment: NEGATIVE

## 2023-08-26 LAB — URINE CULTURE
MICRO NUMBER:: 15866473
SPECIMEN QUALITY:: ADEQUATE

## 2023-08-27 ENCOUNTER — Encounter: Payer: Self-pay | Admitting: Physician Assistant

## 2023-08-27 ENCOUNTER — Other Ambulatory Visit: Payer: Self-pay | Admitting: Physician Assistant

## 2023-08-27 MED ORDER — AMOXICILLIN 875 MG PO TABS: 875.0000 mg | ORAL_TABLET | Freq: Two times a day (BID) | ORAL | 0 refills | Status: AC

## 2023-08-27 MED ORDER — AMOXICILLIN 875 MG PO TABS: 875.0000 mg | ORAL_TABLET | Freq: Two times a day (BID) | ORAL | 0 refills | Status: DC

## 2023-09-20 ENCOUNTER — Ambulatory Visit: Payer: Self-pay | Admitting: Physician Assistant

## 2023-09-20 NOTE — Telephone Encounter (Signed)
  Chief Complaint: Sore throat Symptoms: Sore throat Frequency: Ongoing since today AM Pertinent Negatives: Patient denies fever, HA, N/V Disposition: [] ED /[] Urgent Care (no appt availability in office) / [x] Appointment(In office/virtual)/ []  [place] [facility]/ [] Home Care/ [] Refused Recommended Disposition /[] [place] [facility]/ []  Follow-up with PCP Additional Notes: Pt reports her son was recently diagnosed with strep and she woke up this AM with a ST. Pt denies any fever, HA, N/V, rash. She reports she is still eating and hydrating well but her throat is irritated. She denies pus on her throat and reports only redness. Pt offered appt and declines as her son has tested positive and she would like to ask provider if a script can be sent in for her. This RN advised pt that provider may still require an appt and she understands. This RN educated pt on new-worsening symptoms, when to call back/seek emergent care. Pt verbalized understanding and agrees to plan. Forwarding to provider for review.   Copied from CRM [id number]. Topic: Clinical - Pink Word Triage >> Sep 20, 2023  1:27 PM Leotis ORN wrote: Reason for Triage: Pts son is strep positive, pt is now having sore throat and is wanting medication called in, offered to schedule an appt, but pt is requesting a callback from nurse  [phone] Reason for Disposition  [1] Exposure to family member (or spouse or boyfriend/girlfriend) with test-proven strep AND [2] within last 10 days  Answer Assessment - Initial Assessment Questions 1. ONSET: When did the throat start hurting? (Hours or days ago)      This AM 2. SEVERITY: How bad is the sore throat? (Scale 1-10; mild, moderate or severe)   - MILD (1-3):  Doesn't interfere with eating or normal activities.   - MODERATE (4-7): Interferes with eating some solids and normal activities.   - SEVERE (8-10):  Excruciating pain, interferes with most normal activities.   - SEVERE WITH DYSPHAGIA  (10): Can't swallow liquids, drooling.     Able to eat solids but irritating 3. STREP EXPOSURE: Has there been any exposure to strep within the past week? If Yes, ask: What type of contact occurred?      Yes, son tested positive 4.  VIRAL SYMPTOMS: Are there any symptoms of a cold, such as a runny nose, cough, hoarse voice or red eyes?      None 5. FEVER: Do you have a fever? If Yes, ask: What is your temperature, how was it measured, and when did it start?     None 6. PUS ON THE TONSILS: Is there pus on the tonsils in the back of your throat?     Pt states throat looks red but doesn't see white spots 7. OTHER SYMPTOMS: Do you have any other symptoms? (e.g., difficulty breathing, headache, rash)     None  Protocols used: Sore Throat-A-AH

## 2023-09-20 NOTE — Telephone Encounter (Signed)
 Pt needs an appt. We can not call in medication with out pt being seen.

## 2023-09-21 ENCOUNTER — Ambulatory Visit (INDEPENDENT_AMBULATORY_CARE_PROVIDER_SITE_OTHER): Payer: No Typology Code available for payment source | Admitting: Family Medicine

## 2023-09-21 ENCOUNTER — Encounter: Payer: Self-pay | Admitting: Family Medicine

## 2023-09-21 VITALS — BP 110/78 | HR 119 | Temp 98.3°F | Resp 16 | Ht 62.0 in | Wt 137.5 lb

## 2023-09-21 DIAGNOSIS — J020 Streptococcal pharyngitis: Secondary | ICD-10-CM | POA: Diagnosis not present

## 2023-09-21 DIAGNOSIS — J029 Acute pharyngitis, unspecified: Secondary | ICD-10-CM | POA: Diagnosis not present

## 2023-09-21 LAB — POCT RAPID STREP A (OFFICE): Rapid Strep A Screen: POSITIVE — AB

## 2023-09-21 MED ORDER — AMOXICILLIN 500 MG PO CAPS: 500.0000 mg | ORAL_CAPSULE | Freq: Three times a day (TID) | ORAL | 0 refills | Status: DC

## 2023-09-21 MED ORDER — PENICILLIN V POTASSIUM 500 MG PO TABS: 500.0000 mg | ORAL_TABLET | Freq: Three times a day (TID) | ORAL | 0 refills | Status: DC

## 2023-09-21 NOTE — Patient Instructions (Addendum)
 It was very nice to see you today!  Amoxicillin  sent to pharmacy   PLEASE NOTE:  If you had any lab tests please let us  know if you have not heard back within a few days. You may see your results on MyChart before we have a chance to review them but we will give you a call once they are reviewed by us . If we ordered any referrals today, please let us  know if you have not heard from their office within the next week.   Please try these tips to maintain a healthy lifestyle:  Eat most of your calories during the day when you are active. Eliminate processed foods including packaged sweets (pies, cakes, cookies), reduce intake of potatoes, white bread, white pasta, and white rice. Look for whole grain options, oat flour or almond flour.  Each meal should contain half fruits/vegetables, one quarter protein, and one quarter carbs (no bigger than a computer mouse).  Cut down on sweet beverages. This includes juice, soda, and sweet tea. Also watch fruit intake, though this is a healthier sweet option, it still contains natural sugar! Limit to 3 servings daily.  Drink at least 1 glass of water with each meal and aim for at least 8 glasses per day  Exercise at least 150 minutes every week.

## 2023-09-21 NOTE — Progress Notes (Signed)
 Subjective:     Patient ID: Jenny Lane, female    DOB: [birth year], 38 y.o.   MRN: [id number]  Chief Complaint  Patient presents with   Sore Throat    Sore throat started yesterday Son dx with strep    HPI Discussed the use of AI scribe software for clinical note transcription with the patient, who gave verbal consent to proceed.  History of Present Illness   The patient, Jenny Lane, presented with a one-day history of sore throat, body aches, and slight coughing. She reported significant mucus drainage from the nose and throat, but denied experiencing fevers, vomiting, or diarrhea. The patient's son was recently diagnosed with strep throat, raising the possibility of a similar infection.  In addition to the acute symptoms, the patient also discussed a recent episode of low iron levels following a blood donation. She has been taking iron supplements for a month and was considering continuing regular blood donations. The patient denied heavy bleeding during menstruation and was not experiencing any other symptoms suggestive of anemia.  The patient also expressed concerns about the potential fertility-enhancing effects of prenatal vitamins, which she had been taking due to her iron content.  The patient has a history of multiple drug allergies but confirmed she could tolerate amoxicillin .        Health Maintenance Due  Topic Date Due   Hepatitis C Screening  Never done    Past Medical History:  Diagnosis Date   Depression    History of chicken pox    Tuberculosis    Received 9 Months of Treatement in 2006   Typhoid fever    Varicella    As a child     Past Surgical History:  Procedure Laterality Date   VAGINAL DELIVERY       Current Outpatient Medications:    amoxicillin  (AMOXIL ) 500 MG capsule, Take 1 capsule (500 mg total) by mouth 3 (three) times daily for 7 days., Disp: 21 capsule, Rfl: 0   Cyanocobalamin  (VITAMIN B-12) 5000 MCG TBDP, Take 1 tablet by mouth daily in  the afternoon., Disp: , Rfl:    Fe Bisgly-Vit C-Vit B12-FA (GENTLE IRON) 28-60-0.008-0.4 MG CAPS, , Disp: , Rfl:    fluticasone  (FLONASE ) 50 MCG/ACT nasal spray, Place 2 sprays into both nostrils daily. (Patient taking differently: Place 2 sprays into both nostrils daily as needed.), Disp: 16 g, Rfl: 0  Allergies  Allergen Reactions   Biaxin [Clarithromycin] Anaphylaxis   Cefdinir Hives    Sneezing, swollen lips, and hives.  Last taken 2 months ago for a sinus infection.    Clindamycin/Lincomycin Hives    Patient experienced low back pain, hives, and a dark line around her bottom lip when taking medication.    Sulfa Antibiotics Hives and Swelling    Lips Swell.  Last taken during childhood.    Tetracycline Other (See Comments)   Tetracyclines & Related     Childhood reaction   ROS neg/noncontributory except as noted HPI/below      Objective:     BP 110/78   Pulse (!) 119   Temp 98.3 F (36.8 C) (Oral)   Resp 16   Ht 5' 2 (1.575 m)   Wt 137 lb 8 oz (62.4 kg)   LMP 08/17/2023 (Exact Date)   SpO2 98%   BMI 25.15 kg/m  Wt Readings from Last 3 Encounters:  09/21/23 137 lb 8 oz (62.4 kg)  08/25/23 136 lb 6.1 oz (61.9 kg)  08/13/23 136 lb 3.2 oz (  61.8 kg)    Physical Exam   Gen: WDWN NAD HEENT: NCAT, conjunctiva not injected, sclera nonicteric TM WNL B, OP moist,red. no exudates  NECK:  supple, no thyromegaly, +shoddy nodes CARDIAC:tachy RRR, S1S2+, no murmur.  LUNGS: CTAB. No wheezes MSK: no gross abnormalities.  NEURO: A&O x3.  CN II-XII intact.  PSYCH: normal mood. Good eye contact  Results for orders placed or performed in visit on 09/21/23  POCT rapid strep A   Collection Time: 09/21/23 10:45 AM  Result Value Ref Range   Rapid Strep A Screen Positive (A) Negative        Assessment & Plan:  Strep pharyngitis  Sore throat -     POCT rapid strep A  Other orders -     Amoxicillin ; Take 1 capsule (500 mg total) by mouth 3 (three) times daily for 7 days.   Dispense: 21 capsule; Refill: 0  Assessment and Plan    Streptococcal Pharyngitis Acute sore throat with confirmed strep infection presents with sore throat, body aches, and slight cough, but no fever, vomiting, or diarrhea. A positive strep test confirms the diagnosis. Complete the full course of antibiotics to prevent recurrence and resistance. Avoid sharing drinks, utensils, and close contact to prevent spread. Throat symptoms should improve within 48 hours with rest and medication. Prescribe amoxicillin . Advise rest and hydration. Recommend acetaminophen  or ibuprofen  for pain management.  Iron Deficiency Anemia Low iron levels following blood donation, with no heavy menstrual bleeding or other blood loss sources identified. Currently taking 28 mg iron supplements. Continue supplementation every other day to rebuild iron stores, especially if donating blood regularly. Monitor iron levels periodically. Prenatal vitamins do not increase fertility; use condoms or other birth control to prevent pregnancy.        Return if symptoms worsen or fail to improve.  Jenkins CHRISTELLA Carrel, MD

## 2023-09-22 ENCOUNTER — Ambulatory Visit: Payer: Self-pay | Admitting: Physician Assistant

## 2023-09-22 NOTE — Telephone Encounter (Signed)
 Copied from CRM [id number]. Topic: Clinical - Pink Word Triage >> Sep 22, 2023  4:17 PM Tiffany H wrote: Reason for Triage: Patient called to advise that she's had loose stools since this afternoon. Please assist.   Chief Complaint: Diarrhea Symptoms: Diarrhea, abdominal cramping  Frequency: 4-5 episodes today Pertinent Negatives: Patient denies fever, vomiting Disposition: [] ED /[] Urgent Care (no appt availability in office) / [x] Appointment(In office/virtual)/ []  [facility]/ [] Home Care/ [] Refused Recommended Disposition /[] [place] [facility]/ []  Follow-up with PCP Additional Notes: Patient reports that she began taking Amoxicillin  yesterday and this afternoon began to experience watery diarrhea. Patient states that with her diarrhea she is also experiencing abdominal cramping that she rates as 6/10. Patient denies any fever or vomiting. Advised patient that the diarrhea could be a side effect of the antibiotic and that I would schedule an appointment for the morning to address the diarrhea and see if there needs to be a medication change. Patient understood and is agreeable with this plan.    Reason for Disposition  MODERATE diarrhea (e.g., 4-6 times / day more than normal)  Answer Assessment - Initial Assessment Questions 1. ANTIBIOTIC: "What antibiotic are you taking?" "How many times per day?"     Amoxicillin , 3 times a day 2. ANTIBIOTIC ONSET: "When was the antibiotic started?"     Last night 3. DIARRHEA SEVERITY: "How bad is the diarrhea?" "How many more stools have you had in the past 24 hours than normal?"    - NO DIARRHEA (SCALE 0)   - MILD (SCALE 1-3): Few loose or mushy BMs; increase of 1-3 stools over normal daily number of stools; mild increase in ostomy output.   -  MODERATE (SCALE 4-7): Increase of 4-6 stools daily over normal; moderate increase in ostomy output. * SEVERE (SCALE 8-10; OR 'WORST POSSIBLE'): Increase of 7 or more stools daily over normal;  moderate increase in ostomy output; incontinence.     Moderate 4. ONSET: "When did the diarrhea begin?"      This afternoon 5. BM CONSISTENCY: "How loose or watery is the diarrhea?"      Watery 6. VOMITING: "Are you also vomiting?" If Yes, ask: "How many times in the past 24 hours?"      No 7. ABDOMEN PAIN: "Are you having any abdomen pain?" If Yes, ask: "What does it feel like?" (e.g., crampy, dull, intermittent, constant)      Crampy 8. ABDOMEN PAIN SEVERITY: If present, ask: "How bad is the pain?"  (e.g., Scale 1-10; mild, moderate, or severe)   - MILD (1-3): doesn't interfere with normal activities, abdomen soft and not tender to touch    - MODERATE (4-7): interferes with normal activities or awakens from sleep, abdomen tender to touch    - SEVERE (8-10): excruciating pain, doubled over, unable to do any normal activities       6/10 9. ORAL INTAKE: If vomiting, "Have you been able to drink liquids?" "How much liquids have you had in the past 24 hours?"     N/A 10. HYDRATION: "Any signs of dehydration?" (e.g., dry mouth [not just dry lips], too weak to stand, dizziness, new weight loss) "When did you last urinate?"       No 11. EXPOSURE: "Have you traveled to a foreign country recently?" "Have you been exposed to anyone with diarrhea?" "Could you have eaten any food that was spoiled?"       No 12. OTHER SYMPTOMS: "Do you have any other symptoms?" (e.g., fever,  blood in stool)       No 13. PREGNANCY: "Is there any chance you are pregnant?" "When was your last menstrual period?"       No  Protocols used: Diarrhea on Antibiotics-A-AH

## 2023-09-23 ENCOUNTER — Ambulatory Visit (INDEPENDENT_AMBULATORY_CARE_PROVIDER_SITE_OTHER): Payer: No Typology Code available for payment source | Admitting: Family

## 2023-09-23 ENCOUNTER — Encounter: Payer: Self-pay | Admitting: Family

## 2023-09-23 VITALS — BP 103/78 | HR 95 | Temp 99.2°F | Ht 62.0 in | Wt 138.6 lb

## 2023-09-23 DIAGNOSIS — K521 Toxic gastroenteritis and colitis: Secondary | ICD-10-CM

## 2023-09-23 DIAGNOSIS — R1013 Epigastric pain: Secondary | ICD-10-CM

## 2023-09-23 DIAGNOSIS — J020 Streptococcal pharyngitis: Secondary | ICD-10-CM

## 2023-09-23 MED ORDER — CEPHALEXIN 500 MG PO CAPS: 500.0000 mg | ORAL_CAPSULE | Freq: Two times a day (BID) | ORAL | 0 refills | Status: AC

## 2023-09-23 NOTE — Progress Notes (Signed)
Patient ID: Jenny Lane, female    DOB: August 20, 1986, 38 y.o.   MRN: [id number]  Chief Complaint  Patient presents with   Diarrhea    Pt c/o diarrhea, present since yesterday after starting amoxicillin. Has tried imodium and pepto bismol which did help slightly. Pt did not take this morning       Discussed the use of AI scribe software for clinical note transcription with the patient, who gave verbal consent to proceed.  History of Present Illness   The patient, with a history of strep throat, presents with diarrhea that started after initiating amoxicillin. She reports having 14 episodes of diarrhea yesterday and had taken three doses of amoxicillin before discontinuing it. She has also taken Pepto-Bismol and Imodium to manage the diarrhea, which has improved today. She thinks she has taken amoxicillin before but did not cause any adverse reactions. She also reports experiencing heartburn and frequent burping. She has a history of allergies to several antibiotics, including sulfa, cefdinir, biaxin, and tetracycline.     Assessment & Plan:     Strep Throat - Onset of diarrhea after starting Amoxicillin. Throat pain improving. Multiple antibiotic allergies noted. -Discontinue Amoxicillin. -Start Keflex 500mg  bid x 10d. -Return for retesting if symptoms do not improve after completing new antibiotic.  Acute Diarrhea - Likely secondary to Amoxicillin. Reported 14 episodes yesterday, improving today. -Continue hydration with mostly water and 1-2 cups of Gatorade qd. -Use Pepto-Bismol and Imodium as needed, not exceeding 4 Imodium pills per day. -Return if symptoms worsen or do not improve.  Heartburn and Burping - New onset, likely related to gastrointestinal upset from Amoxicillin. -Continue Pepto-Bismol as needed.     Subjective:    Outpatient Medications Prior to Visit  Medication Sig Dispense Refill   amoxicillin (AMOXIL) 500 MG capsule Take 1 capsule (500 mg total) by mouth 3  (three) times daily for 7 days. 21 capsule 0   Cyanocobalamin (VITAMIN B-12) 5000 MCG TBDP Take 1 tablet by mouth daily in the afternoon.     Fe Bisgly-Vit C-Vit B12-FA (GENTLE IRON) 28-60-0.008-0.4 MG CAPS      fluticasone (FLONASE) 50 MCG/ACT nasal spray Place 2 sprays into both nostrils daily. (Patient taking differently: Place 2 sprays into both nostrils daily as needed.) 16 g 0   No facility-administered medications prior to visit.   Past Medical History:  Diagnosis Date   Depression    History of chicken pox    Tuberculosis    Received 9 Months of Treatement in 2006   Typhoid fever    Varicella    As a child    Past Surgical History:  Procedure Laterality Date   VAGINAL DELIVERY     Allergies  Allergen Reactions   Biaxin [Clarithromycin] Anaphylaxis   Cefdinir Hives    Sneezing, swollen lips, and hives.  Last taken 2 months ago for a sinus infection.    Clindamycin/Lincomycin Hives    Patient experienced low back pain, hives, and a dark line around her bottom lip when taking medication.    Sulfa Antibiotics Hives and Swelling    Lips Swell.  Last taken during childhood.    Tetracycline Other (See Comments)   Tetracyclines & Related     Childhood reaction      Objective:    Physical Exam Vitals and nursing note reviewed.  Constitutional:      Appearance: Normal appearance.  Cardiovascular:     Rate and Rhythm: Normal rate and regular rhythm.  Pulmonary:  Effort: Pulmonary effort is normal.     Breath sounds: Normal breath sounds.  Musculoskeletal:        General: Normal range of motion.  Skin:    General: Skin is warm and dry.  Neurological:     Mental Status: She is alert.  Psychiatric:        Mood and Affect: Mood normal.        Behavior: Behavior normal.    BP 103/78 (BP Location: Left Arm, Patient Position: Sitting, Cuff Size: Normal)   Pulse 95   Temp 99.2 F (37.3 C) (Temporal)   Ht 5\' 2"  (1.575 m)   Wt 138 lb 9.6 oz (62.9 kg)   LMP  08/17/2023 (Exact Date)   SpO2 97%   BMI 25.35 kg/m  Wt Readings from Last 3 Encounters:  09/23/23 138 lb 9.6 oz (62.9 kg)  09/21/23 137 lb 8 oz (62.4 kg)  08/25/23 136 lb 6.1 oz (61.9 kg)       Dulce Sellar, NP

## 2023-09-23 NOTE — Telephone Encounter (Signed)
Please see pt triage notes

## 2023-11-02 ENCOUNTER — Encounter: Payer: Self-pay | Admitting: Physician Assistant

## 2023-11-02 ENCOUNTER — Ambulatory Visit: Payer: No Typology Code available for payment source | Admitting: Physician Assistant

## 2023-11-02 ENCOUNTER — Telehealth: Payer: Self-pay | Admitting: Physician Assistant

## 2023-11-02 ENCOUNTER — Other Ambulatory Visit (HOSPITAL_COMMUNITY)
Admission: RE | Admit: 2023-11-02 | Discharge: 2023-11-02 | Disposition: A | Discharge status: Home / Self Care | Payer: No Typology Code available for payment source | Source: Ambulatory Visit | Attending: Physician Assistant | Admitting: Physician Assistant

## 2023-11-02 VITALS — BP 90/60 | HR 74 | Temp 98.4°F | Ht 62.0 in | Wt 137.2 lb

## 2023-11-02 DIAGNOSIS — N898 Other specified noninflammatory disorders of vagina: Secondary | ICD-10-CM | POA: Diagnosis present

## 2023-11-02 DIAGNOSIS — J029 Acute pharyngitis, unspecified: Secondary | ICD-10-CM | POA: Diagnosis not present

## 2023-11-02 LAB — POCT RAPID STREP A (OFFICE): Rapid Strep A Screen: POSITIVE — AB

## 2023-11-02 MED ORDER — AMOXICILLIN 500 MG PO CAPS: 500.0000 mg | ORAL_CAPSULE | Freq: Two times a day (BID) | ORAL | 0 refills | Status: AC

## 2023-11-02 NOTE — Patient Instructions (Signed)
 It was great to see you!  Start amoxicillin for your strep  We will be in touch with vaginal swab results  Vaginal Hygiene Healthy practices for vaginal hygiene - Avoid pajamas. A robe / nightgown allows better air circulation. Sleep without panties / panties whenever possible. - Wear cotton panties / panties during the day. After washing the panties / panties, rinse them twice to avoid irritating residues. Do not use fabric softeners for panties and / or swimsuits. - Avoid tights, leotard, leggings, tight pants or other tight attire. Loose skirts and pants allow air to circulate. - Avoid the pantiprotector. Better use tampons or sanitary pads. It is beneficial to bathe with warm water every day: - Soak in clean water (without soap) for 10 to 15 minutes. It has not been specifically studied that adding vinegar or baking soda / baking soda to water is of benefit and may not be better than water alone. - Use soap to wash the other regions apart from the genital area before leaving the tub. Limit the use of soap in the genital areas. Use soaps without fragrance. - Rinse the genital area and pat dry. A hair dryer can only be used if cold air is used. - Do not use bubble bath or fragrant soap. - Do not use vaginal sprays or talcum powder. These contain chemicals that irritate the skin. - If the genital area is sore or swollen, fragrance-free disposable wipes can be used instead of toilet paper. - Emollients, such as Vaseline may help protect the skin and may be applied to the irritated area. - Always remember to clean yourself from front to back after bowel movements. Pat dry after urinating. - Don't keep the wet swimsuit on for a long time after swimming.    Take care,  Jarold Motto PA-C

## 2023-11-02 NOTE — Progress Notes (Addendum)
 Jenny Lane is a 38 y.o. female here for a new problem.  History of Present Illness:   Chief Complaint  Patient presents with   vaginal odor    Pt c/o vaginal odor and irritation x 3 days.    Sore Throat    Pt c/o sore throat started yesterday , sneezing, sinus pressure, irritation in her nose.   Vaginal Odor  Patient complains today of experiencing vaginal irritation and odor that has persisted for the past 3 days. No increase discharge, abdominal pain or urinary symptoms at this time.  She does however, report feeling a heat sensation in her vaginal area that tends to wax and wanes. Denies taking any OTC medications. She does states having STD testing done that was negative. Not interested in further testing.   Sore Throat  Patient also complains of a sore throat that started last night/ early morning.  Associated symptoms include sneezing and sinus pressure/Irritation.  States her daughter currently has strep.  Strep test in office is positive today.   Past Medical History:  Diagnosis Date   Depression    History of chicken pox    Tuberculosis    Received 9 Months of Treatement in 2006   Typhoid fever    Varicella    As a child      Social History   Tobacco Use   Smoking status: Never   Smokeless tobacco: Never   Tobacco comments:    Never smoked  Vaping Use   Vaping status: Never Used  Substance Use Topics   Alcohol use: No   Drug use: No    Past Surgical History:  Procedure Laterality Date   VAGINAL DELIVERY      Family History  Problem Relation Age of Onset   Diabetes Father    Cancer Neg Hx     Allergies  Allergen Reactions   Amoxapine And Related Diarrhea   Biaxin [Clarithromycin] Anaphylaxis   Cefdinir Hives    Sneezing, swollen lips, and hives.  Last taken 2 months ago for a sinus infection.    Clindamycin/Lincomycin Hives    Patient experienced low back pain, hives, and a dark line around her bottom lip when taking medication.    Sulfa  Antibiotics Hives and Swelling    Lips Swell.  Last taken during childhood.    Tetracycline Other (See Comments)   Tetracyclines & Related     Childhood reaction    Current Medications:   Current Outpatient Medications:    amoxicillin (AMOXIL) 500 MG capsule, Take 1 capsule (500 mg total) by mouth 2 (two) times daily for 10 days., Disp: 20 capsule, Rfl: 0   Cyanocobalamin (VITAMIN B-12) 5000 MCG TBDP, Take 1 tablet by mouth daily in the afternoon., Disp: , Rfl:    Prenatal Multivit-Min-Fe-FA (PRENATAL/IRON PO), Take 1 tablet by mouth daily in the afternoon., Disp: , Rfl:    Review of Systems:   Review of Systems  HENT:  Positive for sinus pain and sore throat.   Gastrointestinal:  Negative for abdominal pain.  Genitourinary: Negative.     Vitals:   Vitals:   11/02/23 1137  BP: 90/60  Pulse: 74  Temp: 98.4 F (36.9 C)  TempSrc: Temporal  SpO2: 99%  Weight: 137 lb 4 oz (62.3 kg)  Height: 5\' 2"  (1.575 m)     Body mass index is 25.1 kg/m.  Physical Exam:   Physical Exam Exam conducted with a chaperone present.  Constitutional:      Appearance: Normal  appearance. She is well-developed.  HENT:     Head: Normocephalic and atraumatic.  Eyes:     General: Lids are normal.     Extraocular Movements: Extraocular movements intact.     Conjunctiva/sclera: Conjunctivae normal.  Pulmonary:     Effort: Pulmonary effort is normal.  Genitourinary:    Vagina: Vaginal discharge present.  Musculoskeletal:        General: Normal range of motion.     Cervical back: Normal range of motion and neck supple.  Skin:    General: Skin is warm and dry.  Neurological:     Mental Status: She is alert and oriented to person, place, and time.  Psychiatric:        Attention and Perception: Attention and perception normal.        Mood and Affect: Mood normal.        Behavior: Behavior normal.        Thought Content: Thought content normal.        Judgment: Judgment normal.    Results  for orders placed or performed in visit on 11/02/23  POCT rapid strep A  Result Value Ref Range   Rapid Strep A Screen Positive (A) Negative    Assessment and Plan:   Sore throat No red flags on exam.   Will initiate amoxicillin per orders.  She reports diarrhea with amoxicillin, however she has anaphylaxis to macrolides and hives with clindamycin, so our alternate options are limited Recommended bland diet with probiotics for her diarrhea if occurs Discussed taking medications as prescribed.  Reviewed return precautions including new or worsening fever, SOB, new or worsening cough or other concerns.  Push fluids and rest.  I recommend that patient follow-up if symptoms worsen or persist despite treatment x 7-10 days, sooner if needed.  Vaginal odor Vaginal swab obtained Will add treatment(s) based on results Follow-up as needed Declines sexual transmitted infection testing  Jarold Motto, PA-C  I,Jenny Lane,acting as a scribe for Jarold Motto, PA.,have documented all relevant documentation on the behalf of Jarold Motto, PA,as directed by  Jarold Motto, PA while in the presence of Jarold Motto, [place].   I, Jarold Motto, [place], have reviewed all documentation for this visit. The documentation on 11/02/23 for the exam, diagnosis, procedures, and orders are all accurate and complete.

## 2023-11-02 NOTE — Telephone Encounter (Signed)
 Spoke to pt told her you have taken Amoxicillin in the past with no problems. Is there a new allergy for this that we do not know about?  Pt said last time she took it she had diarrhea really bad. Told her discussed with Lelon Mast, the only other option with her current allergies would be clindamycin -- which I'm concerned could worsen any sort of "tummy ache"  I recommend bland foods and lots of fluids for a tummy ache. Told her Lelon Mast will send Rx into the pharmacy. Pt verbalized understanding.

## 2023-11-02 NOTE — Telephone Encounter (Signed)
 Please see message.

## 2023-11-02 NOTE — Telephone Encounter (Signed)
 Copied from CRM [id number]. Topic: Clinical - Prescription Issue >> Nov 02, 2023 12:47 PM Jenny Lane wrote: Reason for CRM: patient was seen in office today and given Amoxicillin 500 mg She is requesting another medication because she is allergic to it

## 2023-11-02 NOTE — Telephone Encounter (Signed)
 See My chart message

## 2023-11-02 NOTE — Telephone Encounter (Signed)
 Spoke to pt told her per Lelon Mast, Upon further review of her chart, she has an allergy to clindamycin as well.   The best option given all of this is to take the amoxicillin and to take probiotics OTC (available over the counter without a prescription) with this. Pt verbalized understanding.

## 2023-11-03 ENCOUNTER — Encounter: Payer: Self-pay | Admitting: Physician Assistant

## 2023-11-03 LAB — CERVICOVAGINAL ANCILLARY ONLY
Bacterial Vaginitis (gardnerella): NEGATIVE
Candida Glabrata: NEGATIVE
Candida Vaginitis: NEGATIVE
Comment: NEGATIVE
Comment: NEGATIVE
Comment: NEGATIVE

## 2024-05-10 ENCOUNTER — Encounter: Payer: Self-pay | Admitting: Physician Assistant

## 2024-05-10 ENCOUNTER — Ambulatory Visit (INDEPENDENT_AMBULATORY_CARE_PROVIDER_SITE_OTHER): Admitting: Physician Assistant

## 2024-05-10 VITALS — BP 98/60 | HR 67 | Temp 98.2°F | Ht 62.0 in | Wt 135.0 lb

## 2024-05-10 DIAGNOSIS — H6122 Impacted cerumen, left ear: Secondary | ICD-10-CM | POA: Diagnosis not present

## 2024-05-10 DIAGNOSIS — J029 Acute pharyngitis, unspecified: Secondary | ICD-10-CM | POA: Diagnosis not present

## 2024-05-10 LAB — POCT RAPID STREP A (OFFICE): Rapid Strep A Screen: NEGATIVE

## 2024-05-10 NOTE — Patient Instructions (Addendum)
 It was great to see you!  Mineral oil for ear full of wax OR can buy debrox OTC (available over the counter without a prescription) or hydrogen peroxide Purchase mineral oil from laxative aisle or hydrogen peroxide from first aid Lay down on your side with ear that is bothering you facing up Use 3-4 drops with a dropper and place in ear for 30 seconds Place cotton swab outside of ear Turn to other side and allow this to drain Repeat 3-4 x a day Return to see us  if not improving within a few days  You have a viral upper respiratory infection. Antibiotics are not needed for this.  Viral infections usually take 7-10 days to resolve.  The cough can last a few weeks to go away.  Push fluids and get plenty of rest. Please return if you are not improving as expected, or if you have high fevers (>101.5) or difficulty swallowing or worsening productive cough.  Call clinic with questions.  I hope you start feeling better soon!   Take care,  Lucie Buttner PA-C

## 2024-05-10 NOTE — Progress Notes (Signed)
 Jenny Lane is a 38 y.o. female here for a new problem.  History of Present Illness:   Chief Complaint  Patient presents with   Nasal Congestion    Pt c/o nasal congestion and sore throat started this morning, also having congestion in both ears since swimming over the weekend.    Discussed the use of AI scribe software for clinical note transcription with the patient, who gave verbal consent to proceed.  History of Present Illness Jenny Lane is a 38 year old female who presents with ear blockage and sore throat after swimming.  She experienced blocked ears with a sensation of hearing loss for three days after swimming. She has been using cotton buds for ear cleaning. She also has a sore throat with a 'sour like' sensation and nasal congestion that started this morning. There is no fever or chills. She has been using Flonase  for the past three to four days. She experiences thick mucus in her throat and drainage without a runny nose. No other over-the-counter medications have been used.  Reports there is no fevers/chills    Past Medical History:  Diagnosis Date   Depression    History of chicken pox    Tuberculosis    Received 9 Months of Treatement in 2006   Typhoid fever    Varicella    As a child      Social History   Tobacco Use   Smoking status: Never   Smokeless tobacco: Never   Tobacco comments:    Never smoked  Vaping Use   Vaping status: Never Used  Substance Use Topics   Alcohol use: No   Drug use: No    Past Surgical History:  Procedure Laterality Date   VAGINAL DELIVERY      Family History  Problem Relation Age of Onset   Diabetes Father    Cancer Neg Hx     Allergies  Allergen Reactions   Amoxapine And Related Diarrhea   Biaxin [Clarithromycin] Anaphylaxis   Cefdinir Hives    Sneezing, swollen lips, and hives.  Last taken 2 months ago for a sinus infection.    Clindamycin/Lincomycin Hives    Patient experienced low back pain, hives, and a  dark line around her bottom lip when taking medication.    Sulfa Antibiotics Hives and Swelling    Lips Swell.  Last taken during childhood.    Tetracycline Other (See Comments)   Tetracyclines & Related     Childhood reaction    Current Medications:   Current Outpatient Medications:    Probiotic Product (PROBIOTIC DIGESTIVE SUPP PO), Take 1 capsule by mouth daily in the afternoon., Disp: , Rfl:    Cyanocobalamin  (VITAMIN B-12) 5000 MCG TBDP, Take 1 tablet by mouth daily in the afternoon., Disp: , Rfl:    fluticasone  (FLONASE ) 50 MCG/ACT nasal spray, Place 2 sprays into both nostrils daily., Disp: , Rfl:    Prenatal Multivit-Min-Fe-FA (PRENATAL/IRON PO), Take 1 tablet by mouth daily in the afternoon., Disp: , Rfl:    Review of Systems:   Negative unless otherwise specified per HPI.  Vitals:   There were no vitals filed for this visit.   There is no height or weight on file to calculate BMI.  Physical Exam:   Physical Exam Vitals and nursing note reviewed.  Constitutional:      General: She is not in acute distress.    Appearance: She is well-developed. She is not ill-appearing or toxic-appearing.  HENT:  Head: Normocephalic and atraumatic.     Right Ear: Tympanic membrane, ear canal and external ear normal. Tympanic membrane is not erythematous, retracted or bulging.     Left Ear: Ear canal and external ear normal. There is impacted cerumen.     Nose: Nose normal.     Right Sinus: No maxillary sinus tenderness or frontal sinus tenderness.     Left Sinus: No maxillary sinus tenderness or frontal sinus tenderness.     Mouth/Throat:     Pharynx: Uvula midline. No posterior oropharyngeal erythema.  Eyes:     General: Lids are normal.     Conjunctiva/sclera: Conjunctivae normal.  Neck:     Trachea: Trachea normal.  Cardiovascular:     Rate and Rhythm: Normal rate and regular rhythm.     Heart sounds: Normal heart sounds, S1 normal and S2 normal.  Pulmonary:     Effort:  Pulmonary effort is normal.     Breath sounds: Normal breath sounds. No decreased breath sounds, wheezing, rhonchi or rales.  Lymphadenopathy:     Cervical: No cervical adenopathy.  Skin:    General: Skin is warm and dry.  Neurological:     Mental Status: She is alert.  Psychiatric:        Speech: Speech normal.        Behavior: Behavior normal. Behavior is cooperative.    Results for orders placed or performed in visit on 05/10/24  POCT rapid strep A  Result Value Ref Range   Rapid Strep A Screen Negative Negative  ' Assessment and Plan:   Assessment and Plan Assessment & Plan Cerumen impaction, left ear Earwax impaction in the left ear causing hearing difficulties due to obstruction. - Advise use of hydrogen peroxide to loosen earwax and facilitate removal.  Sore throat Sore throat with no evidence of bacterial infection, likely viral in etiology. - Recommend ibuprofen  for sore throat to reduce inflammation. - Strep test is negative - Continue using Flonase  for nasal congestion relief. - follow up if new/worsening/lack of improvement     Lucie Buttner, PA-C

## 2024-05-11 ENCOUNTER — Encounter: Payer: Self-pay | Admitting: Physician Assistant

## 2024-05-11 ENCOUNTER — Ambulatory Visit: Admitting: Family

## 2024-05-17 ENCOUNTER — Encounter: Payer: Self-pay | Admitting: Physician Assistant

## 2024-05-17 ENCOUNTER — Ambulatory Visit (INDEPENDENT_AMBULATORY_CARE_PROVIDER_SITE_OTHER): Admitting: Physician Assistant

## 2024-05-17 VITALS — BP 108/60 | HR 80 | Temp 98.6°F | Ht 62.0 in | Wt 134.0 lb

## 2024-05-17 DIAGNOSIS — H579 Unspecified disorder of eye and adnexa: Secondary | ICD-10-CM | POA: Diagnosis not present

## 2024-05-17 DIAGNOSIS — H6993 Unspecified Eustachian tube disorder, bilateral: Secondary | ICD-10-CM | POA: Diagnosis not present

## 2024-05-17 DIAGNOSIS — H6122 Impacted cerumen, left ear: Secondary | ICD-10-CM | POA: Diagnosis not present

## 2024-05-17 NOTE — Patient Instructions (Signed)
 It was great to see you!  You could consider daily allergy pill for itchy eyes such as claritin/allegra/zyrtec/xzyal -- generic is also fine!  Consider pataday drops --> generic is fine too!    Take care,  Lucie Buttner PA-C

## 2024-05-17 NOTE — Progress Notes (Signed)
 Jenny Lane is a 38 y.o. female here for a follow up of a pre-existing problem.  History of Present Illness:   Chief Complaint  Patient presents with   Cerumen Impaction    Pt c/o both ears blocked, has been using Debrox drops.   Eye Problem    Pt c/o bilateral eyes red and itchy, started this morning.    Patient reports left ear cerumen impaction She has been using debrox without relief Reports there is no ear pain Having some decreased hearing  Eyes were both very itchy this morning She rubbed them and they are red No pain or issues with vision    Past Medical History:  Diagnosis Date   Depression    History of chicken pox    Tuberculosis    Received 9 Months of Treatement in 2006   Typhoid fever    Varicella    As a child      Social History   Tobacco Use   Smoking status: Never   Smokeless tobacco: Never   Tobacco comments:    Never smoked  Vaping Use   Vaping status: Never Used  Substance Use Topics   Alcohol use: No   Drug use: No    Past Surgical History:  Procedure Laterality Date   VAGINAL DELIVERY      Family History  Problem Relation Age of Onset   Diabetes Father    Cancer Neg Hx     Allergies  Allergen Reactions   Amoxapine And Related Diarrhea   Biaxin [Clarithromycin] Anaphylaxis   Cefdinir Hives    Sneezing, swollen lips, and hives.  Last taken 2 months ago for a sinus infection.    Clindamycin/Lincomycin Hives    Patient experienced low back pain, hives, and a dark line around her bottom lip when taking medication.    Sulfa Antibiotics Hives and Swelling    Lips Swell.  Last taken during childhood.    Tetracycline Other (See Comments)   Tetracyclines & Related     Childhood reaction    Current Medications:   Current Outpatient Medications:    Cyanocobalamin (VITAMIN B-12) 5000 MCG TBDP, Take 1 tablet by mouth daily in the afternoon., Disp: , Rfl:    fluticasone  (FLONASE ) 50 MCG/ACT nasal spray, Place 2 sprays into both  nostrils daily., Disp: , Rfl:    Prenatal Multivit-Min-Fe-FA (PRENATAL/IRON PO), Take 1 tablet by mouth daily in the afternoon., Disp: , Rfl:    Probiotic Product (PROBIOTIC DIGESTIVE SUPP PO), Take 1 capsule by mouth daily in the afternoon., Disp: , Rfl:    Simethicone  ([employer] GAS RELIEF PO), Take 250 mg by mouth at bedtime., Disp: , Rfl:    Review of Systems:   Negative unless otherwise specified per HPI.  Vitals:   Vitals:   05/17/24 1329  BP: 108/60  Pulse: 80  Temp: 98.6 F (37 C)  TempSrc: Temporal  SpO2: 99%  Weight: 134 lb (60.8 kg)  Height: 5' 2 (1.575 m)     Body mass index is 24.51 kg/m.  Physical Exam:   Physical Exam Vitals and nursing note reviewed.  Constitutional:      General: She is not in acute distress.    Appearance: She is well-developed. She is not ill-appearing or toxic-appearing.  HENT:     Head: Normocephalic and atraumatic.     Right Ear: Ear canal and external ear normal. A middle ear effusion is present. Tympanic membrane is not erythematous, retracted or bulging.  Left Ear: Tympanic membrane, ear canal and external ear normal. There is impacted cerumen.     Nose: Nose normal.     Right Sinus: No maxillary sinus tenderness or frontal sinus tenderness.     Left Sinus: No maxillary sinus tenderness or frontal sinus tenderness.     Mouth/Throat:     Pharynx: Uvula midline. No posterior oropharyngeal erythema.  Eyes:     General: Lids are normal.     Conjunctiva/sclera:     Right eye: Right conjunctiva is injected.     Left eye: Left conjunctiva is injected.  Neck:     Trachea: Trachea normal.  Cardiovascular:     Rate and Rhythm: Normal rate and regular rhythm.     Heart sounds: Normal heart sounds, S1 normal and S2 normal.  Pulmonary:     Effort: Pulmonary effort is normal.     Breath sounds: Normal breath sounds. No decreased breath sounds, wheezing, rhonchi or rales.  Lymphadenopathy:     Cervical: No cervical adenopathy.  Skin:     General: Skin is warm and dry.  Neurological:     Mental Status: She is alert.  Psychiatric:        Speech: Speech normal.        Behavior: Behavior normal. Behavior is cooperative.    Ceruminosis is noted.  Wax is removed by syringing and manual debridement.    Assessment and Plan:   Impacted cerumen of left ear Tolerated procedure well No evidence of infection after procedure  Dysfunction of both eustachian tubes Advised trialing daily antihistamine to help with symptom(s) Follow up if new/worsening/lack of improvement May need to consider nasal spray, sudafed or even systemic steroids if no improvement  Itchy eyes No red flags on exam Trial pataday drops Follow up as needed   Lucie Buttner, PA-C

## 2024-07-05 ENCOUNTER — Encounter: Payer: Self-pay | Admitting: Physician Assistant

## 2024-07-05 ENCOUNTER — Ambulatory Visit: Admitting: Physician Assistant

## 2024-07-05 VITALS — BP 110/78 | HR 76 | Temp 98.3°F | Ht 62.0 in | Wt 134.0 lb

## 2024-07-05 DIAGNOSIS — E559 Vitamin D deficiency, unspecified: Secondary | ICD-10-CM | POA: Diagnosis not present

## 2024-07-05 DIAGNOSIS — E538 Deficiency of other specified B group vitamins: Secondary | ICD-10-CM

## 2024-07-05 DIAGNOSIS — M545 Low back pain, unspecified: Secondary | ICD-10-CM | POA: Diagnosis not present

## 2024-07-05 LAB — POCT URINE PREGNANCY: Preg Test, Ur: NEGATIVE

## 2024-07-05 NOTE — Progress Notes (Signed)
 Jenny Lane is a 38 y.o. female here for a follow up of a pre-existing problem.  History of Present Illness:   Chief Complaint  Patient presents with   Back Pain    Patient constant pain no Hx injury  Requesting UPT    Discussed the use of AI scribe software for clinical note transcription with the patient, who gave verbal consent to proceed.  History of Present Illness   Jenny Lane is a 38 year old female who presents with sudden onset back pain.  She experiences continuous back pain that began suddenly during intercourse, located in the middle of her back, slightly to the right, and occasionally on the left. The pain is rated 6/10 in severity and does not radiate to her buttocks or legs. She has not used any treatments such as Tylenol , ibuprofen , or a heating pad. There is no history of prior back injury.  She switched from normal milk to almond milk a month ago on the advice of her GI doctor to help with gas, which has improved. She is concerned about the impact on her vitamin D3 levels. She is not on any prescription medications, only taking vitamin D3 and B12 supplements.  There are no urinary symptoms such as increased frequency or burning. She does not experience weakness, numbness, or tingling in her legs. She has not been engaging in exercise or activities like yoga.     She reports that she has had issues with teeth clenching.   Past Medical History:  Diagnosis Date   Depression    History of chicken pox    Tuberculosis    Received 9 Months of Treatement in 2006   Typhoid fever    Varicella    As a child      Social History   Tobacco Use   Smoking status: Never   Smokeless tobacco: Never   Tobacco comments:    Never smoked  Vaping Use   Vaping status: Never Used  Substance Use Topics   Alcohol use: No   Drug use: No    Past Surgical History:  Procedure Laterality Date   VAGINAL DELIVERY      Family History  Problem Relation Age of Onset   Diabetes  Father    Cancer Neg Hx     Allergies  Allergen Reactions   Amoxapine And Related Diarrhea   Biaxin [Clarithromycin] Anaphylaxis   Cefdinir Hives    Sneezing, swollen lips, and hives.  Last taken 2 months ago for a sinus infection.    Clindamycin/Lincomycin Hives    Patient experienced low back pain, hives, and a dark Lane around her bottom lip when taking medication.    Sulfa Antibiotics Hives and Swelling    Lips Swell.  Last taken during childhood.    Tetracycline Other (See Comments)   Tetracyclines & Related     Childhood reaction    Current Medications:   Current Outpatient Medications:    Cyanocobalamin (VITAMIN B-12) 5000 MCG TBDP, Take 1 tablet by mouth daily in the afternoon., Disp: , Rfl:    fluticasone  (FLONASE ) 50 MCG/ACT nasal spray, Place 2 sprays into both nostrils daily., Disp: , Rfl:    Probiotic Product (PROBIOTIC DIGESTIVE SUPP PO), Take 1 capsule by mouth daily in the afternoon., Disp: , Rfl:    Simethicone  ([employer] GAS RELIEF PO), Take 250 mg by mouth at bedtime., Disp: , Rfl:    Review of Systems:   Negative unless otherwise specified per HPI.  Vitals:  Vitals:   07/05/24 1522  BP: 110/78  Pulse: 76  Temp: 98.3 F (36.8 C)  TempSrc: Temporal  SpO2: 96%  Weight: 134 lb (60.8 kg)  Height: 5' 2 (1.575 m)     Body mass index is 24.51 kg/m.  Physical Exam:   Physical Exam Vitals and nursing note reviewed.  Constitutional:      General: She is not in acute distress.    Appearance: She is well-developed. She is not ill-appearing or toxic-appearing.  Cardiovascular:     Rate and Rhythm: Normal rate and regular rhythm.     Pulses: Normal pulses.     Heart sounds: Normal heart sounds, S1 normal and S2 normal.  Pulmonary:     Effort: Pulmonary effort is normal.     Breath sounds: Normal breath sounds.  Musculoskeletal:     Comments: No decreased ROM 2/2 pain with flexion/extension, lateral side bends, or rotation. Reproducible tenderness with  deep palpation to bilateral paraspinal muscles. No bony tenderness.    Skin:    General: Skin is warm and dry.  Neurological:     Mental Status: She is alert.     GCS: GCS eye subscore is 4. GCS verbal subscore is 5. GCS motor subscore is 6.  Psychiatric:        Speech: Speech normal.        Behavior: Behavior normal. Behavior is cooperative.    Results for orders placed or performed in visit on 07/05/24  POCT urine pregnancy  Result Value Ref Range   Preg Test, Ur Negative Negative    Assessment and Plan:   Assessment and Plan    Acute midline low back pain Acute low back pain. No radiation or urinary symptoms. No red flags. - Trial of ibuprofen  for inflammation. - Provide gentle exercises for mobility. - Consider back x-ray if pain persists.   Vitamin D  and B12 deficiency - Check vitamin D  and B12 levels per patient request   Lucie Buttner, PA-C

## 2024-07-05 NOTE — Patient Instructions (Signed)
  VISIT SUMMARY: You came in today with sudden onset back pain and concerns about jaw pain and teeth clenching. We discussed possible causes and treatments for both issues.  YOUR PLAN: ACUTE LOW BACK PAIN: You have sudden back pain, located in the middle of your back, slightly to the right, and occasionally on the left. -Take ibuprofen  to help with inflammation. -Do gentle exercises to improve mobility. -If the pain continues, we may need to do a back x-ray. -We will check your vitamin D  and B12 levels to ensure they are adequate.  JAW PAIN AND TEETH CLENCHING: You have jaw pain and teeth clenching, which may be related to a calcium deficiency. There is also jaw dislocation and noise noted. -We will check your calcium levels to see if a deficiency is contributing to your symptoms.                      Contains text generated by Abridge.                                 Contains text generated by Abridge.

## 2024-07-06 ENCOUNTER — Ambulatory Visit: Payer: Self-pay | Admitting: Physician Assistant

## 2024-07-06 LAB — VITAMIN D 25 HYDROXY (VIT D DEFICIENCY, FRACTURES): VITD: 21.12 ng/mL — ABNORMAL LOW (ref 30.00–100.00)

## 2024-07-06 LAB — COMPREHENSIVE METABOLIC PANEL WITH GFR
ALT: 11 U/L (ref 0–35)
AST: 14 U/L (ref 0–37)
Albumin: 4.3 g/dL (ref 3.5–5.2)
Alkaline Phosphatase: 41 U/L (ref 39–117)
BUN: 15 mg/dL (ref 6–23)
CO2: 29 meq/L (ref 19–32)
Calcium: 9 mg/dL (ref 8.4–10.5)
Chloride: 101 meq/L (ref 96–112)
Creatinine, Ser: 0.63 mg/dL (ref 0.40–1.20)
GFR: 111.76 mL/min (ref >60.00)
Glucose, Bld: 128 mg/dL — ABNORMAL HIGH (ref 70–99)
Potassium: 3.9 meq/L (ref 3.5–5.1)
Sodium: 137 meq/L (ref 135–145)
Total Bilirubin: 0.4 mg/dL (ref 0.2–1.2)
Total Protein: 7 g/dL (ref 6.0–8.3)

## 2024-07-06 LAB — VITAMIN B12: Vitamin B-12: 1074 pg/mL — ABNORMAL HIGH (ref 211–911)

## 2024-07-06 MED ORDER — VITAMIN D (ERGOCALCIFEROL) 1.25 MG (50000 UNIT) PO CAPS: 50000.0000 [IU] | ORAL_CAPSULE | ORAL | 0 refills | Status: DC

## 2024-07-31 ENCOUNTER — Encounter: Payer: Self-pay | Admitting: Physician Assistant

## 2024-07-31 ENCOUNTER — Ambulatory Visit: Admitting: Physician Assistant

## 2024-07-31 VITALS — BP 110/76 | HR 77 | Temp 97.3°F | Ht 62.0 in | Wt 134.5 lb

## 2024-07-31 DIAGNOSIS — Z0000 Encounter for general adult medical examination without abnormal findings: Secondary | ICD-10-CM

## 2024-07-31 DIAGNOSIS — E559 Vitamin D deficiency, unspecified: Secondary | ICD-10-CM

## 2024-07-31 DIAGNOSIS — Z136 Encounter for screening for cardiovascular disorders: Secondary | ICD-10-CM

## 2024-07-31 DIAGNOSIS — Z8632 Personal history of gestational diabetes: Secondary | ICD-10-CM | POA: Diagnosis not present

## 2024-07-31 DIAGNOSIS — Z13220 Encounter for screening for lipoid disorders: Secondary | ICD-10-CM

## 2024-07-31 DIAGNOSIS — R238 Other skin changes: Secondary | ICD-10-CM

## 2024-07-31 LAB — LIPID PANEL
Cholesterol: 138 mg/dL (ref 0–200)
HDL: 50.4 mg/dL (ref >39.00)
LDL Cholesterol: 78 mg/dL (ref 0–99)
NonHDL: 87.18
Total CHOL/HDL Ratio: 3
Triglycerides: 47 mg/dL (ref 0.0–149.0)
VLDL: 9.4 mg/dL (ref 0.0–40.0)

## 2024-07-31 LAB — TSH: TSH: 1.13 u[IU]/mL (ref 0.35–5.50)

## 2024-07-31 LAB — HEMOGLOBIN A1C: Hgb A1c MFr Bld: 5.3 % (ref 4.6–6.5)

## 2024-07-31 NOTE — Progress Notes (Signed)
 Subjective:    Jenny Lane is a 38 y.o. female and is here for a comprehensive physical exam.  HPI  There are no preventive care reminders to display for this patient.  Discussed the use of AI scribe software for clinical note transcription with the patient, who gave verbal consent to proceed.  History of Present Illness   Jenny Lane is a 38 year old female who presents for routine follow-up and lab checks including A1c and cholesterol.  She has prior gestational diabetes and monitors her A1c, which has been 5.5% since December. She takes vitamin D .  She has a family history of breast cancer in a paternal cousin diagnosed at 48 and a paternal aunt. She had a mammogram earlier this year that was normal but noted dense breasts.  She was seen by gastroenterology and recommended testing for small intestinal bacterial overgrowth, which is scheduled in January. She has mild flatulence but no other significant gastrointestinal symptoms.  She walks about ten minutes every two days and tries to eat healthily. Sleep and mood are good, and alcohol use is unchanged.  She has had jaw sensitivity without pain or swelling for one to two months and denies numbness, tingling, tremor, or ankle pain or swelling.       Health Maintenance: Immunizations -- UpToDate -- declined flu shot Colonoscopy -- n/a Mammogram -- UpToDate per patient PAP -- UpToDate  Bone Density -- n/a Diet -- healthy diet as able Exercise -- 10 minute walk every 2 days  Sleep habits -- no major concerns Mood -- overall stable  UTD with dentist? - yes UTD with eye doctor? - no  Weight history: Wt Readings from Last 10 Encounters:  07/31/24 134 lb 8 oz (61 kg)  07/05/24 134 lb (60.8 kg)  05/17/24 134 lb (60.8 kg)  05/10/24 135 lb (61.2 kg)  11/02/23 137 lb 4 oz (62.3 kg)  09/23/23 138 lb 9.6 oz (62.9 kg)  09/21/23 137 lb 8 oz (62.4 kg)  08/25/23 136 lb 6.1 oz (61.9 kg)  08/13/23 136 lb 3.2 oz (61.8 kg)   03/10/23 143 lb (64.9 kg)   Body mass index is 24.6 kg/m. Patient's last menstrual period was 07/28/2024 (exact date).  Alcohol use:  reports no history of alcohol use.  Tobacco use:  Tobacco Use: Low Risk  (07/31/2024)   Patient History    Smoking Tobacco Use: Never    Smokeless Tobacco Use: Never    Passive Exposure: Not on file   Eligible for lung cancer screening? no     07/31/2024    1:02 PM  Depression screen PHQ 2/9  Decreased Interest 0  Down, Depressed, Hopeless 0  PHQ - 2 Score 0  Altered sleeping 0  Tired, decreased energy 0  Change in appetite 0  Feeling bad or failure about yourself  0  Trouble concentrating 0  Moving slowly or fidgety/restless 0  Suicidal thoughts 0  PHQ-9 Score 0  Difficult doing work/chores Not difficult at all     Other providers/specialists: Patient Care Team: Job Lukes, [place] as PCP - General (Physician Assistant) O'Neal, Darryle Ned, MD as PCP - Cardiology (Internal Medicine) Dow Maxwell, PT as Physical Therapist (Physical Therapy) Latisha Medford, MD as Consulting Physician (Obstetrics and Gynecology)    PMHx, SurgHx, SocialHx, Medications, and Allergies were reviewed in the Visit Navigator and updated as appropriate.   Past Medical History:  Diagnosis Date   Depression    History of chicken pox    Tuberculosis  Received 9 Months of Treatement in 2006   Typhoid fever    Varicella    As a child      Past Surgical History:  Procedure Laterality Date   VAGINAL DELIVERY       Family History  Problem Relation Age of Onset   Diabetes Father    Cancer Neg Hx     Social History   Tobacco Use   Smoking status: Never   Smokeless tobacco: Never   Tobacco comments:    Never smoked  Vaping Use   Vaping status: Never Used  Substance Use Topics   Alcohol use: No   Drug use: No    Review of Systems:   Review of Systems  Constitutional:  Negative for chills, fever, malaise/fatigue and weight  loss.  HENT:  Negative for hearing loss, sinus pain and sore throat.   Respiratory:  Negative for cough and hemoptysis.   Cardiovascular:  Negative for chest pain, palpitations, leg swelling and PND.  Gastrointestinal:  Negative for abdominal pain, constipation, diarrhea, heartburn, nausea and vomiting.  Genitourinary:  Negative for dysuria, frequency and urgency.  Musculoskeletal:  Negative for back pain, myalgias and neck pain.  Skin:  Negative for itching and rash.  Neurological:  Negative for dizziness, tingling, seizures and headaches.  Endo/Heme/Allergies:  Negative for polydipsia.  Psychiatric/Behavioral:  Negative for depression. The patient is not nervous/anxious.     Objective:   BP 110/76 (BP Location: Left Arm, Patient Position: Sitting, Cuff Size: Normal)   Pulse 77   Temp (!) 97.3 F (36.3 C) (Temporal)   Ht 5' 2 (1.575 m)   Wt 134 lb 8 oz (61 kg)   LMP 07/28/2024 (Exact Date)   SpO2 98%   BMI 24.60 kg/m  Body mass index is 24.6 kg/m.   General Appearance:    Alert, cooperative, no distress, appears stated age  Head:    Normocephalic, without obvious abnormality, atraumatic  Eyes:    PERRL, conjunctiva/corneas clear, EOM's intact, fundi    benign, both eyes  Ears:    Normal TM's and external ear canals, both ears  Nose:   Nares normal, septum midline, mucosa normal, no drainage    or sinus tenderness  Throat:   Lips, mucosa, and tongue normal; teeth and gums normal  Neck:   Supple, symmetrical, trachea midline, no adenopathy;    thyroid :  no enlargement/tenderness/nodules; no carotid   bruit or JVD  Back:     Symmetric, no curvature, ROM normal, no CVA tenderness  Lungs:     Clear to auscultation bilaterally, respirations unlabored  Chest Wall:    No tenderness or deformity   Heart:    Regular rate and rhythm, S1 and S2 normal, no murmur, rub or gallop  Breast Exam:    Deferred  Abdomen:     Soft, non-tender, bowel sounds active all four quadrants,    no  masses, no organomegaly  Genitalia:    Deferred   Extremities:   Extremities normal, atraumatic, no cyanosis or edema  Pulses:   2+ and symmetric all extremities  Skin:   Skin color, texture, turgor normal, no rashes or lesions  Lymph nodes:   Cervical, supraclavicular, and axillary nodes normal  Neurologic:   CNII-XII intact, normal strength, sensation and reflexes    throughout    Assessment/Plan:   Assessment and Plan    Adult Wellness Visit Routine wellness visit. Family history of breast cancer. Occasional clenching sensation. Eye distortion reported. Blood pressure controlled. History  of gestational diabetes. - Ordered A1c and cholesterol panel. - Continue annual mammograms. - Encouraged regular exercise and healthy diet.  Vitamin D  deficiency Resolved backache with vitamin D  supplementation. No current deficiency symptoms. - Continue vitamin D  supplementation. - Paused rechecking vitamin D  levels.  History of gestational diabetes Update Hemoglobin A1c and provide recommendations   Skin sensitivity Neck sensitivity without pain or enlargement. Previous normal thyroid  tests. - Ordered thyroid  function tests. - Consider imaging if symptom(s) persist  Screening for lipoid disorders Routine screening as part of wellness visit. - Ordered cholesterol panel.      Lucie Buttner, PA-C [facility] [place]

## 2024-08-01 ENCOUNTER — Ambulatory Visit: Payer: Self-pay | Admitting: Physician Assistant

## 2024-08-21 ENCOUNTER — Encounter: Payer: Self-pay | Admitting: Physician Assistant

## 2024-08-21 ENCOUNTER — Ambulatory Visit: Admitting: Physician Assistant

## 2024-08-21 VITALS — BP 110/64 | HR 89 | Temp 97.9°F | Ht 62.0 in | Wt 137.0 lb

## 2024-08-21 DIAGNOSIS — J020 Streptococcal pharyngitis: Secondary | ICD-10-CM

## 2024-08-21 DIAGNOSIS — J00 Acute nasopharyngitis [common cold]: Secondary | ICD-10-CM

## 2024-08-21 LAB — POCT RAPID STREP A (OFFICE): Rapid Strep A Screen: POSITIVE — AB

## 2024-08-21 MED ORDER — AMOXICILLIN 500 MG PO CAPS: 500.0000 mg | ORAL_CAPSULE | Freq: Two times a day (BID) | ORAL | 0 refills | Status: AC

## 2024-08-21 NOTE — Patient Instructions (Addendum)
 When you are flying, use a topical nasal decongestant, such as Afrin, on the night before flying, the morning of departure, and the evening of arrival.  Also recommend one-time 60 mg dose of Sudafed at the time of departure.  May also try to actively clear ear pressure by swallowing, yawning, chewing gum. Do not use Afrin for more than 3 days.    VISIT SUMMARY: Today, you were seen for a sore throat and tested positive for strep throat. You also have symptoms of a common cold with congestion and green mucus. We discussed your concerns about potential diarrhea from antibiotics and your upcoming travel plans.  YOUR PLAN: STREPTOCOCCAL PHARYNGITIS: You have a strep throat infection confirmed by a positive test. You have a sore throat, swollen lymph nodes, and green mucus but no fever or cough. -Take amoxicillin  twice a day for 10 days. -To prevent diarrhea, consider taking probiotics or eating Activia yogurt. -You should no longer be contagious after 48 hours on the medication.                      Contains text generated by Abridge.                                 Contains text generated by Abridge.

## 2024-08-21 NOTE — Progress Notes (Signed)
 Patient ID: Jenny Lane, female    DOB: 08-Jan-1986, 38 y.o.   MRN: [id number]   Assessment & Plan:  Strep pharyngitis -     POCT rapid strep A  Common cold  Other orders -     Amoxicillin ; Take 1 capsule (500 mg total) by mouth 2 (two) times daily for 10 days.  Dispense: 20 capsule; Refill: 0      Assessment and Plan Assessment & Plan Streptococcal pharyngitis Positive strep throat test with sore throat for 3-4 days. No fever or chills. Swollen lymph nodes present. No ear infection. No cough, but green mucus production. No contraindications to amoxicillin . Discussed potential for diarrhea with amoxicillin  and recommended probiotics or Activia yogurt to mitigate this risk. Advised that after 48 hours on medication, she should no longer be contagious. - Prescribed amoxicillin  twice a day for 10 days - Recommended probiotics or Activia yogurt to prevent diarrhea - Advised that after 48 hours on medication, she should no longer be contagious  Acute nasopharyngitis (common cold) Congestion with green mucus production, particularly in the mornings. No cough or wheezing. Lungs clear on examination. Discussed use of topical decongestants and Sudafed for symptom relief, especially in preparation for flying. - Recommended Afrin nasal spray the night before flying, and the morning and evening upon arrival - Advised taking Sudafed when preparing to leave for the airplane - Provided printed instructions for decongestion management    F/up prn     Subjective:    Chief Complaint  Patient presents with   Sore Throat    Pt c/o sore throat for the past 3 days and also green mucus from nose and throat; pt taking OTC Nyquil and Zyrtec; Pt also using Flonase  and saline spray    Sore Throat    Discussed the use of AI scribe software for clinical note transcription with the patient, who gave verbal consent to proceed.  History of Present Illness Jenny Lane is a 38 year old female who  presents with a sore throat and positive strep test.  She has experienced a sore throat for the past three to four days. A strep test was performed and returned positive. No fever, chills, or cough, but she has green mucus from her nose and throat, particularly in the mornings.  She is concerned about potential diarrhea from antibiotics, especially with upcoming travel.  She is planning to fly to [place] this coming weekend and is concerned about spreading the infection.  She has been using Flonase  and saline spray to manage congestion.     Past Medical History:  Diagnosis Date   Depression    GERD (gastroesophageal reflux disease) Gerd   History of chicken pox    Tuberculosis    Received 9 Months of Treatement in 2006   Typhoid fever    Varicella    As a child     Past Surgical History:  Procedure Laterality Date   VAGINAL DELIVERY      Family History  Problem Relation Age of Onset   Diabetes Father    Breast cancer Paternal Aunt    Breast cancer Paternal Cousin 57    Social History[1]   Allergies[2]  Review of Systems NEGATIVE UNLESS OTHERWISE INDICATED IN HPI      Objective:     BP 110/64 (BP Location: Left Arm, Patient Position: Sitting, Cuff Size: Normal)   Pulse 89   Temp 97.9 F (36.6 C) (Temporal)   Ht 5' 2 (1.575 m)   Wt  137 lb (62.1 kg)   LMP 07/28/2024 (Exact Date)   SpO2 100%   BMI 25.06 kg/m   Wt Readings from Last 3 Encounters:  08/21/24 137 lb (62.1 kg)  07/31/24 134 lb 8 oz (61 kg)  07/05/24 134 lb (60.8 kg)    BP Readings from Last 3 Encounters:  08/21/24 110/64  07/31/24 110/76  07/05/24 110/78     Physical Exam Vitals and nursing note reviewed.  Constitutional:      General: She is not in acute distress.    Appearance: Normal appearance. She is not ill-appearing.  HENT:     Head: Normocephalic.     Right Ear: Tympanic membrane, ear canal and external ear normal.     Left Ear: Tympanic membrane, ear canal and external  ear normal.     Nose: Congestion present.     Mouth/Throat:     Mouth: Mucous membranes are moist.     Pharynx: Uvula midline. No oropharyngeal exudate or posterior oropharyngeal erythema.     Tonsils: No tonsillar exudate or tonsillar abscesses.  Eyes:     Extraocular Movements: Extraocular movements intact.     Conjunctiva/sclera: Conjunctivae normal.     Pupils: Pupils are equal, round, and reactive to light.  Cardiovascular:     Rate and Rhythm: Normal rate and regular rhythm.     Pulses: Normal pulses.     Heart sounds: Normal heart sounds. No murmur heard. Pulmonary:     Effort: Pulmonary effort is normal. No respiratory distress.     Breath sounds: Normal breath sounds. No wheezing.  Musculoskeletal:     Cervical back: Normal range of motion.  Skin:    General: Skin is warm.  Neurological:     Mental Status: She is alert and oriented to person, place, and time.  Psychiatric:        Mood and Affect: Mood normal.        Behavior: Behavior normal.             Nael Petrosyan M Tela Kotecki, PA-C    [1]  Social History Tobacco Use   Smoking status: Never   Smokeless tobacco: Never   Tobacco comments:    Never smoked  Vaping Use   Vaping status: Never Used  Substance Use Topics   Alcohol use: No   Drug use: No  [2]  Allergies Allergen Reactions   Amoxapine And Related Diarrhea   Biaxin [Clarithromycin] Anaphylaxis   Cefdinir Hives    Sneezing, swollen lips, and hives.  Last taken 2 months ago for a sinus infection.    Clindamycin/Lincomycin Hives    Patient experienced low back pain, hives, and a dark line around her bottom lip when taking medication.    Sulfa Antibiotics Hives and Swelling    Lips Swell.  Last taken during childhood.    Tetracycline Other (See Comments)   Tetracyclines & Related     Childhood reaction

## 2024-08-21 NOTE — Telephone Encounter (Signed)
 Please see pt msg and request for note for flying and advise

## 2024-09-22 ENCOUNTER — Encounter: Payer: Self-pay | Admitting: Physician Assistant

## 2024-09-28 ENCOUNTER — Telehealth: Payer: Self-pay | Admitting: Physician Assistant

## 2024-09-28 NOTE — Telephone Encounter (Signed)
 Pt said she would like to keep 1/26 appt. Called to r/s due to weather.

## 2024-09-29 ENCOUNTER — Encounter: Payer: Self-pay | Admitting: Physician Assistant

## 2024-10-02 ENCOUNTER — Ambulatory Visit: Admitting: Physician Assistant

## 2024-10-05 ENCOUNTER — Encounter: Payer: Self-pay | Admitting: Physician Assistant

## 2024-10-05 ENCOUNTER — Ambulatory Visit: Admitting: Physician Assistant

## 2024-10-05 VITALS — BP 110/70 | HR 77 | Temp 98.6°F | Ht 62.0 in | Wt 140.0 lb

## 2024-10-05 DIAGNOSIS — E559 Vitamin D deficiency, unspecified: Secondary | ICD-10-CM

## 2024-10-05 DIAGNOSIS — R143 Flatulence: Secondary | ICD-10-CM

## 2024-10-05 DIAGNOSIS — J00 Acute nasopharyngitis [common cold]: Secondary | ICD-10-CM

## 2024-10-05 DIAGNOSIS — R0981 Nasal congestion: Secondary | ICD-10-CM

## 2024-10-05 LAB — POCT INFLUENZA A/B
Influenza A, POC: NEGATIVE
Influenza B, POC: NEGATIVE

## 2024-10-05 LAB — VITAMIN D 25 HYDROXY (VIT D DEFICIENCY, FRACTURES): VITD: 38.1 ng/mL (ref 30.00–100.00)

## 2024-10-05 LAB — POC COVID19 BINAXNOW: SARS Coronavirus 2 Ag: NEGATIVE

## 2024-10-05 NOTE — Progress Notes (Signed)
 "  History of Present Illness:   Chief Complaint  Patient presents with   Nasal Congestion    Pt c/o white nasal congestion x 2 days. Denies fever or chills.    Discussed the use of AI scribe software for clinical note transcription with the patient, who gave verbal consent to proceed.  History of Present Illness   Zanyiah Posten is a 39 year old female who presents with a scratchy throat and stuffy nose.  She has a scratchy throat and stuffy nose without sore throat, painful swallowing, body aches, chills, fever, cough, or ear pain.  She has gastrointestinal discomfort. She eats mostly fresh fruits, vegetables, brown rice, and whole wheat bread with limited processed foods. She is not using fiber supplements or probiotics and has not previously discussed fiber intake.   She would like vitamin D  updated today. Vitamin D  supplement helps her.       Past Medical History:  Diagnosis Date   Depression    GERD (gastroesophageal reflux disease) Gerd   History of chicken pox    Tuberculosis    Received 9 Months of Treatement in 2006   Typhoid fever    Varicella    As a child      Social History[1]  Past Surgical History:  Procedure Laterality Date   VAGINAL DELIVERY      Family History  Problem Relation Age of Onset   Diabetes Father    Breast cancer Paternal Aunt    Breast cancer Paternal Cousin 35    Allergies[2]  Current Medications:  Current Medications[3]   Review of Systems:   Negative unless otherwise specified per HPI.  Vitals:   Vitals:   10/05/24 1128  BP: 110/70  Pulse: 77  Temp: 98.6 F (37 C)  TempSrc: Temporal  SpO2: 97%  Weight: 140 lb (63.5 kg)  Height: 5' 2 (1.575 m)     Body mass index is 25.61 kg/m.  Physical Exam:   Physical Exam Vitals and nursing note reviewed.  Constitutional:      General: She is not in acute distress.    Appearance: She is well-developed. She is not ill-appearing or toxic-appearing.  HENT:     Head:  Normocephalic and atraumatic.     Right Ear: Tympanic membrane, ear canal and external ear normal. Tympanic membrane is not erythematous, retracted or bulging.     Left Ear: Tympanic membrane, ear canal and external ear normal. Tympanic membrane is not erythematous, retracted or bulging.     Nose: Nose normal.     Right Sinus: No maxillary sinus tenderness or frontal sinus tenderness.     Left Sinus: No maxillary sinus tenderness or frontal sinus tenderness.     Mouth/Throat:     Pharynx: Uvula midline. No posterior oropharyngeal erythema.  Eyes:     General: Lids are normal.     Conjunctiva/sclera: Conjunctivae normal.  Neck:     Trachea: Trachea normal.  Cardiovascular:     Rate and Rhythm: Normal rate and regular rhythm.     Pulses: Normal pulses.     Heart sounds: Normal heart sounds, S1 normal and S2 normal.  Pulmonary:     Effort: Pulmonary effort is normal.     Breath sounds: Normal breath sounds. No decreased breath sounds, wheezing, rhonchi or rales.  Lymphadenopathy:     Cervical: No cervical adenopathy.  Skin:    General: Skin is warm and dry.  Neurological:     Mental Status: She is alert.  GCS: GCS eye subscore is 4. GCS verbal subscore is 5. GCS motor subscore is 6.  Psychiatric:        Speech: Speech normal.        Behavior: Behavior normal. Behavior is cooperative.     Assessment and Plan:   Assessment and Plan    Acute nasopharyngitis (common cold) Symptoms include nasal congestion and scratchy throat. Flu and COVID test are negative. Suspect viral upper respiratory infection (URI)   Vitamin D  deficiency Vitamin D  levels need re-evaluation. - Ordered vitamin D  level test.  Passing flatus Discussed dietary fiber intake and its impact on bowel movements. - Start probiotic supplement consistently for two weeks. - Encourage physical activity to aid bowel movements.       Lucie Buttner, PA-C     [1]  Social History Tobacco Use   Smoking  status: Never   Smokeless tobacco: Never   Tobacco comments:    Never smoked  Vaping Use   Vaping status: Never Used  Substance Use Topics   Alcohol use: No   Drug use: No  [2]  Allergies Allergen Reactions   Amoxapine And Related Diarrhea   Biaxin [Clarithromycin] Anaphylaxis   Cefdinir Hives    Sneezing, swollen lips, and hives.  Last taken 2 months ago for a sinus infection.    Clindamycin/Lincomycin Hives    Patient experienced low back pain, hives, and a dark line around her bottom lip when taking medication.    Sulfa Antibiotics Hives and Swelling    Lips Swell.  Last taken during childhood.    Tetracycline Other (See Comments)   Tetracyclines & Related     Childhood reaction  [3]  Current Outpatient Medications:    cetirizine (ZYRTEC) 10 MG chewable tablet, Chew 10 mg by mouth daily., Disp: , Rfl:    fluticasone  (FLONASE ) 50 MCG/ACT nasal spray, Place into both nostrils daily., Disp: , Rfl:    SALINE NASAL SPRAY NA, Place 1 spray into the nose daily in the afternoon., Disp: , Rfl:   "

## 2024-10-06 ENCOUNTER — Ambulatory Visit: Payer: Self-pay | Admitting: Physician Assistant

## 2024-10-06 ENCOUNTER — Encounter: Payer: Self-pay | Admitting: Physician Assistant

## 2024-10-09 ENCOUNTER — Ambulatory Visit: Admitting: Physician Assistant

## 2025-08-01 ENCOUNTER — Encounter: Admitting: Physician Assistant
